# Patient Record
Sex: Male | Born: 1960 | Race: White | Hispanic: No | Marital: Married | Smoking: Former smoker
Health system: Southern US, Community
[De-identification: ages and names within clinical notes are randomized; demographics above are authoritative.]

## PROBLEM LIST (undated history)

## (undated) DIAGNOSIS — K219 Gastro-esophageal reflux disease without esophagitis: Secondary | ICD-10-CM

## (undated) DIAGNOSIS — E785 Hyperlipidemia, unspecified: Secondary | ICD-10-CM

## (undated) DIAGNOSIS — M1990 Unspecified osteoarthritis, unspecified site: Secondary | ICD-10-CM

## (undated) DIAGNOSIS — Z8614 Personal history of Methicillin resistant Staphylococcus aureus infection: Secondary | ICD-10-CM

## (undated) DIAGNOSIS — I10 Essential (primary) hypertension: Secondary | ICD-10-CM

## (undated) HISTORY — DX: Hyperlipidemia, unspecified: E78.5

## (undated) HISTORY — DX: Unspecified osteoarthritis, unspecified site: M19.90

## (undated) HISTORY — DX: Gastro-esophageal reflux disease without esophagitis: K21.9

## (undated) HISTORY — DX: Personal history of Methicillin resistant Staphylococcus aureus infection: Z86.14

---

## 2005-12-30 ENCOUNTER — Emergency Department: Payer: Self-pay

## 2006-04-23 ENCOUNTER — Ambulatory Visit: Payer: Self-pay | Admitting: Internal Medicine

## 2006-04-23 ENCOUNTER — Encounter: Payer: Self-pay | Admitting: Internal Medicine

## 2006-04-23 DIAGNOSIS — K219 Gastro-esophageal reflux disease without esophagitis: Secondary | ICD-10-CM | POA: Insufficient documentation

## 2006-04-23 DIAGNOSIS — E785 Hyperlipidemia, unspecified: Secondary | ICD-10-CM | POA: Insufficient documentation

## 2006-04-23 LAB — CONVERTED CEMR LAB
ALT: 28 units/L (ref 0–40)
AST: 25 units/L (ref 0–37)
Albumin: 4 g/dL (ref 3.5–5.2)
Alkaline Phosphatase: 62 units/L (ref 39–117)
BUN: 13 mg/dL (ref 6–23)
Basophils Absolute: 0 10*3/uL (ref 0.0–0.1)
Basophils Relative: 0.4 % (ref 0.0–1.0)
Bilirubin, Direct: 0.1 mg/dL (ref 0.0–0.3)
CO2: 31 meq/L (ref 19–32)
Calcium: 9.1 mg/dL (ref 8.4–10.5)
Chloride: 103 meq/L (ref 96–112)
Cholesterol: 281 mg/dL (ref 0–200)
Creatinine, Ser: 0.9 mg/dL (ref 0.4–1.5)
Direct LDL: 168 mg/dL
Eosinophils Absolute: 0.1 10*3/uL (ref 0.0–0.6)
Eosinophils Relative: 2.2 % (ref 0.0–5.0)
GFR calc Af Amer: 117 mL/min
GFR calc non Af Amer: 97 mL/min
Glucose, Bld: 93 mg/dL (ref 70–99)
HCT: 42.2 % (ref 39.0–52.0)
HDL: 40.4 mg/dL (ref >39.0)
Hemoglobin: 14.8 g/dL (ref 13.0–17.0)
Lymphocytes Relative: 32.7 % (ref 12.0–46.0)
MCHC: 35 g/dL (ref 30.0–36.0)
MCV: 92.8 fL (ref 78.0–100.0)
Monocytes Absolute: 0.4 10*3/uL (ref 0.2–0.7)
Monocytes Relative: 8.2 % (ref 3.0–11.0)
Neutro Abs: 3.1 10*3/uL (ref 1.4–7.7)
Neutrophils Relative %: 56.5 % (ref 43.0–77.0)
Platelets: 212 10*3/uL (ref 150–400)
Potassium: 4.4 meq/L (ref 3.5–5.1)
RBC: 4.55 M/uL (ref 4.22–5.81)
RDW: 12.1 % (ref 11.5–14.6)
Sodium: 141 meq/L (ref 135–145)
TSH: 1.69 microintl units/mL (ref 0.35–5.50)
Total Bilirubin: 0.9 mg/dL (ref 0.3–1.2)
Total CHOL/HDL Ratio: 7
Total Protein: 7.1 g/dL (ref 6.0–8.3)
Triglycerides: 316 mg/dL (ref 0–149)
VLDL: 63 mg/dL — ABNORMAL HIGH (ref 0–40)
WBC: 5.3 10*3/uL (ref 4.5–10.5)

## 2006-08-28 ENCOUNTER — Encounter (INDEPENDENT_AMBULATORY_CARE_PROVIDER_SITE_OTHER): Payer: Self-pay | Admitting: *Deleted

## 2006-08-31 ENCOUNTER — Ambulatory Visit: Payer: Self-pay | Admitting: Internal Medicine

## 2006-09-03 LAB — CONVERTED CEMR LAB
Cholesterol: 280 mg/dL (ref 0–200)
Direct LDL: 192 mg/dL
HDL: 43.3 mg/dL (ref >39.0)
Total CHOL/HDL Ratio: 6.5
Triglycerides: 167 mg/dL — ABNORMAL HIGH (ref 0–149)
VLDL: 33 mg/dL (ref 0–40)

## 2006-11-27 ENCOUNTER — Telehealth (INDEPENDENT_AMBULATORY_CARE_PROVIDER_SITE_OTHER): Payer: Self-pay | Admitting: *Deleted

## 2006-11-29 ENCOUNTER — Ambulatory Visit: Payer: Self-pay | Admitting: Internal Medicine

## 2006-11-29 DIAGNOSIS — L03119 Cellulitis of unspecified part of limb: Secondary | ICD-10-CM

## 2006-11-29 DIAGNOSIS — L02419 Cutaneous abscess of limb, unspecified: Secondary | ICD-10-CM | POA: Insufficient documentation

## 2007-03-27 ENCOUNTER — Ambulatory Visit: Payer: Self-pay | Admitting: Family Medicine

## 2007-03-27 DIAGNOSIS — G479 Sleep disorder, unspecified: Secondary | ICD-10-CM | POA: Insufficient documentation

## 2007-04-09 ENCOUNTER — Ambulatory Visit: Payer: Self-pay | Admitting: Family Medicine

## 2007-05-10 ENCOUNTER — Ambulatory Visit: Payer: Self-pay | Admitting: Family Medicine

## 2007-05-13 ENCOUNTER — Telehealth: Payer: Self-pay | Admitting: Family Medicine

## 2007-06-25 ENCOUNTER — Telehealth: Payer: Self-pay | Admitting: Family Medicine

## 2007-07-11 ENCOUNTER — Ambulatory Visit: Payer: Self-pay | Admitting: Family Medicine

## 2007-07-11 DIAGNOSIS — F341 Dysthymic disorder: Secondary | ICD-10-CM | POA: Insufficient documentation

## 2007-12-17 ENCOUNTER — Ambulatory Visit: Payer: Self-pay | Admitting: Family Medicine

## 2008-04-14 ENCOUNTER — Ambulatory Visit: Payer: Self-pay | Admitting: Family Medicine

## 2008-07-01 ENCOUNTER — Ambulatory Visit: Payer: Self-pay | Admitting: Internal Medicine

## 2008-07-02 LAB — CONVERTED CEMR LAB
Cholesterol: 309 mg/dL — ABNORMAL HIGH (ref 0–200)
Direct LDL: 179.2 mg/dL
Glucose, Bld: 95 mg/dL (ref 70–99)
HDL: 43.2 mg/dL (ref >39.00)
Total CHOL/HDL Ratio: 7
Triglycerides: 496 mg/dL — ABNORMAL HIGH (ref 0.0–149.0)
VLDL: 99.2 mg/dL — ABNORMAL HIGH (ref 0.0–40.0)

## 2008-08-25 ENCOUNTER — Telehealth: Payer: Self-pay | Admitting: Internal Medicine

## 2008-11-19 ENCOUNTER — Ambulatory Visit: Payer: Self-pay | Admitting: Internal Medicine

## 2008-11-19 DIAGNOSIS — M19049 Primary osteoarthritis, unspecified hand: Secondary | ICD-10-CM | POA: Insufficient documentation

## 2008-11-21 LAB — CONVERTED CEMR LAB: Anti Nuclear Antibody(ANA): NEGATIVE

## 2008-11-24 LAB — CONVERTED CEMR LAB
ALT: 25 units/L (ref 0–53)
AST: 21 units/L (ref 0–37)
Albumin: 4.1 g/dL (ref 3.5–5.2)
Alkaline Phosphatase: 66 units/L (ref 39–117)
BUN: 17 mg/dL (ref 6–23)
Basophils Absolute: 0.1 10*3/uL (ref 0.0–0.1)
Basophils Relative: 0.8 % (ref 0.0–3.0)
Bilirubin, Direct: 0 mg/dL (ref 0.0–0.3)
CO2: 31 meq/L (ref 19–32)
Calcium: 9.2 mg/dL (ref 8.4–10.5)
Chloride: 105 meq/L (ref 96–112)
Creatinine, Ser: 1.3 mg/dL (ref 0.4–1.5)
Eosinophils Absolute: 0.1 10*3/uL (ref 0.0–0.7)
Eosinophils Relative: 1.3 % (ref 0.0–5.0)
GFR calc non Af Amer: 62.44 mL/min (ref >60)
Glucose, Bld: 68 mg/dL — ABNORMAL LOW (ref 70–99)
HCT: 39.9 % (ref 39.0–52.0)
Hemoglobin: 13.7 g/dL (ref 13.0–17.0)
Lymphocytes Relative: 27.1 % (ref 12.0–46.0)
Lymphs Abs: 1.8 10*3/uL (ref 0.7–4.0)
MCHC: 34.4 g/dL (ref 30.0–36.0)
MCV: 96.7 fL (ref 78.0–100.0)
Monocytes Absolute: 0.6 10*3/uL (ref 0.1–1.0)
Monocytes Relative: 8.2 % (ref 3.0–12.0)
Neutro Abs: 4.2 10*3/uL (ref 1.4–7.7)
Neutrophils Relative %: 62.6 % (ref 43.0–77.0)
Phosphorus: 3.3 mg/dL (ref 2.3–4.6)
Platelets: 209 10*3/uL (ref 150.0–400.0)
Potassium: 3.9 meq/L (ref 3.5–5.1)
RBC: 4.13 M/uL — ABNORMAL LOW (ref 4.22–5.81)
RDW: 11.6 % (ref 11.5–14.6)
Rhuematoid fact SerPl-aCnc: 20 intl units/mL (ref 0.0–20.0)
Sed Rate: 17 mm/hr (ref 0–22)
Sodium: 134 meq/L — ABNORMAL LOW (ref 135–145)
TSH: 1.78 microintl units/mL (ref 0.35–5.50)
Total Bilirubin: 1 mg/dL (ref 0.3–1.2)
Total Protein: 7.2 g/dL (ref 6.0–8.3)
WBC: 6.8 10*3/uL (ref 4.5–10.5)

## 2008-12-04 ENCOUNTER — Ambulatory Visit: Payer: Self-pay | Admitting: Internal Medicine

## 2008-12-10 ENCOUNTER — Encounter: Payer: Self-pay | Admitting: Internal Medicine

## 2008-12-16 ENCOUNTER — Ambulatory Visit: Payer: Self-pay | Admitting: Family Medicine

## 2008-12-16 DIAGNOSIS — J019 Acute sinusitis, unspecified: Secondary | ICD-10-CM | POA: Insufficient documentation

## 2008-12-18 ENCOUNTER — Ambulatory Visit: Payer: Self-pay | Admitting: Rheumatology

## 2008-12-23 ENCOUNTER — Encounter: Payer: Self-pay | Admitting: Internal Medicine

## 2009-02-09 ENCOUNTER — Telehealth: Payer: Self-pay | Admitting: Internal Medicine

## 2009-07-07 ENCOUNTER — Ambulatory Visit: Payer: Self-pay | Admitting: Internal Medicine

## 2009-07-07 DIAGNOSIS — R079 Chest pain, unspecified: Secondary | ICD-10-CM | POA: Insufficient documentation

## 2009-07-22 LAB — CONVERTED CEMR LAB
ALT: 26 units/L (ref 0–53)
AST: 24 units/L (ref 0–37)
Albumin: 4.1 g/dL (ref 3.5–5.2)
Alkaline Phosphatase: 67 units/L (ref 39–117)
BUN: 15 mg/dL (ref 6–23)
Basophils Absolute: 0 10*3/uL (ref 0.0–0.1)
Basophils Relative: 0.7 % (ref 0.0–3.0)
Bilirubin, Direct: 0.1 mg/dL (ref 0.0–0.3)
CO2: 28 meq/L (ref 19–32)
Calcium: 9.7 mg/dL (ref 8.4–10.5)
Chloride: 105 meq/L (ref 96–112)
Cholesterol: 277 mg/dL — ABNORMAL HIGH (ref 0–200)
Creatinine, Ser: 1.3 mg/dL (ref 0.4–1.5)
Direct LDL: 157.9 mg/dL
Eosinophils Absolute: 0.2 10*3/uL (ref 0.0–0.7)
Eosinophils Relative: 3.2 % (ref 0.0–5.0)
GFR calc non Af Amer: 62.84 mL/min (ref >60)
Glucose, Bld: 106 mg/dL — ABNORMAL HIGH (ref 70–99)
HCT: 40.6 % (ref 39.0–52.0)
HDL: 44.7 mg/dL (ref >39.00)
Hemoglobin: 14.4 g/dL (ref 13.0–17.0)
Lymphocytes Relative: 30.5 % (ref 12.0–46.0)
Lymphs Abs: 1.7 10*3/uL (ref 0.7–4.0)
MCHC: 35.3 g/dL (ref 30.0–36.0)
MCV: 96.1 fL (ref 78.0–100.0)
Monocytes Absolute: 0.4 10*3/uL (ref 0.1–1.0)
Monocytes Relative: 7.4 % (ref 3.0–12.0)
Neutro Abs: 3.2 10*3/uL (ref 1.4–7.7)
Neutrophils Relative %: 58.2 % (ref 43.0–77.0)
Phosphorus: 3.3 mg/dL (ref 2.3–4.6)
Platelets: 213 10*3/uL (ref 150.0–400.0)
Potassium: 5.3 meq/L — ABNORMAL HIGH (ref 3.5–5.1)
RBC: 4.23 M/uL (ref 4.22–5.81)
RDW: 13.1 % (ref 11.5–14.6)
Sodium: 141 meq/L (ref 135–145)
TSH: 2.73 microintl units/mL (ref 0.35–5.50)
Total Bilirubin: 0.5 mg/dL (ref 0.3–1.2)
Total CHOL/HDL Ratio: 6
Total Protein: 7 g/dL (ref 6.0–8.3)
Triglycerides: 342 mg/dL — ABNORMAL HIGH (ref 0.0–149.0)
VLDL: 68.4 mg/dL — ABNORMAL HIGH (ref 0.0–40.0)
WBC: 5.6 10*3/uL (ref 4.5–10.5)

## 2009-11-06 ENCOUNTER — Emergency Department: Payer: Self-pay | Admitting: Emergency Medicine

## 2009-12-03 ENCOUNTER — Ambulatory Visit: Payer: Self-pay | Admitting: Internal Medicine

## 2009-12-03 DIAGNOSIS — L049 Acute lymphadenitis, unspecified: Secondary | ICD-10-CM | POA: Insufficient documentation

## 2009-12-30 ENCOUNTER — Encounter: Payer: Self-pay | Admitting: Internal Medicine

## 2010-02-23 ENCOUNTER — Encounter: Payer: Self-pay | Admitting: Internal Medicine

## 2010-03-15 NOTE — Letter (Signed)
Summary: Hand pain/[facility]  Hand pain/[facility]   Imported By: Sherian Rein 12/21/2008 07:47:18  _____________________________________________________________________  External Attachment:    Type:   Image     Comment:   External Document  Appended Document: Hand pain/[facility] work up for synovitis started checking MRI and may inject

## 2010-03-15 NOTE — Letter (Signed)
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

## 2010-03-15 NOTE — Assessment & Plan Note (Signed)
Summary: CPX   Vital Signs:  Patient Profile:   50 Years Old Male Height:     69.75 inches Weight:      207.13 pounds Temp:     99.1 degrees F oral Pulse rate:   68 / minute BP sitting:   96 / 58  (right arm)  Vitals Entered By: Wandra Mannan (August 31, 2006 9:20 AM)               Chief Complaint:  cpx  fasting.  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      has noticed close up vision changes  ENT      Denies decreased hearing and ringing in ears.      Teeth okay--sees dentist regularly  CV      Denies difficulty breathing at night, fainting, palpitations, and shortness of breath with exertion.   Resp      Denies cough and shortness of breath.  GI      Denies abdominal pain, bloody stools, change in bowel habits, constipation, nausea, and vomiting.      heartburn controlled by nexium  GU      Denies decreased libido, erectile dysfunction, incontinence, and urinary hesitancy.      occ nocturia  MS      some knee arthritis---cold weather exacerbates with stiffness---no meds  Derm      Denies lesion(s) and rash.      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      R knee has numb feeling--occ kneels and gets numb down lateral upper calf  Psych      Denies anxiety and depression.  Heme      Denies abnormal bruising and enlarge lymph nodes.  Allergy      Denies seasonal allergies.   Physical  Exam  General:     alert and normal appearance.   Eyes:     pupils equal, pupils round, pupils reactive to light, and no optic disk abnormalities.   Ears:     R ear normal and L ear normal.   Mouth:     no lesions.   Neck:     supple, no masses, no thyromegaly, no carotid bruits, and no cervical lymphadenopathy.   Lungs:     normal respiratory effort and normal breath sounds.   Heart:     normal rate, regular rhythm, no murmur, and no gallop.   Abdomen:     soft, non-tender, no masses, no inguinal hernia, no hepatomegaly, and no splenomegaly.   Genitalia:     circumcised, no scrotal masses, and no testicular masses or atrophy.   Msk:     no joint tenderness and no joint swelling.   Pulses:     1= in feet Extremities:     no edema Skin:     no rashes and no suspicious lesions.   Axillary Nodes:     No palpable lymphadenopathy Psych:     normally interactive, good eye contact, not anxious appearing, and not depressed appearing.      Impression & Recommendations:  Problem # 1:  HEALTH MAINTENANCE EXAM (ICD-V70.0) Assessment: Comment Only healthy counselled  Problem # 2:  HYPERLIPIDEMIA (ICD-272.4) Assessment: Unchanged  Discussed fish oil would avoid other meds at least now--may want to consider as his risk goes up Orders: Venipuncture (16109) TLB-Lipid Panel (80061-LIPID)   Medications Added to Medication List This Visit: 1)  Nexium 40 Mg Pack (Esomeprazole magnesium) .Marland Kitchen.. 1 daily   Patient Instructions: 1)  physical in 1-2 years       Prior Medications (reviewed today): Current Allergies (reviewed today): CODEINE SULFATE (CODEINE SULFATE)  Appended Document: CPX discussed trying famotidine or omeprazole OTC for GER

## 2010-03-15 NOTE — Miscellaneous (Signed)
  Clinical Lists Changes  Allergies: Added new allergy or adverse reaction of CODEINE SULFATE (CODEINE SULFATE) Observations: Added new observation of NKA: F (08/28/2006 16:38)

## 2010-03-15 NOTE — Assessment & Plan Note (Signed)
Summary: 1:45  L HAND/CLE   Vital Signs:  Patient profile:   50 year old male Weight:      212 pounds Temp:     98.2 degrees F oral Pulse rate:   80 / minute Pulse rhythm:   regular BP sitting:   120 / 70  (left arm) Cuff size:   regular  Vitals Entered By: Mervin Hack CMA (AAMA) (November 19, 2008 1:50 PM)  History of Present Illness: Chief Complaint: left hand pain  Having swelling and inflammation in left hand for about 2 weeks Lots of pain  No known injury works with hands all the time but nothing new Taking advil-- 400 twice today and then took an aleve does relieve some of the aching  Worsens as the day goes on still present in AM but not as bad  No redness No fever  Some tiredness and fatigue  Allergies: 1)  Codeine Sulfate (Codeine Sulfate)  Past History:  Past medical, surgical, family and social histories (including risk factors) reviewed for relevance to current acute and chronic problems.  Past Medical History: Reviewed history from 04/23/2006 and no changes required. GERD Hyperlipidemia MRSA after chain saw accident  Family History: Dad died brain tumor 07-05-44) Mom--HTN 1 brother  No prostate or colon cancer No CAD Both GM and pat GF with rheumatoid arthritis  Social History: Reviewed history from 07/01/2008 and no changes required. 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   Neck:  supple, no masses, no thyromegaly, no carotid bruits, and no cervical lymphadenopathy.   Lungs:  normal respiratory effort and normal breath sounds.   Heart:  normal rate, regular rhythm, no murmur,  and no gallop.   Abdomen:  soft and non-tender.   Genitalia:  no scrotal masses, no testicular masses or atrophy, and no cutaneous lesions.   Msk:  swelling and synovitis of left 3rd MCP all other joints quiet No redness   mild tenderness in just that joint Extremities:  no edema Neurologic:  alert & oriented X3, strength normal in all extremities, and gait normal.   Skin:  no rashes and no suspicious lesions.   Axillary Nodes:  No palpable lymphadenopathy Inguinal Nodes:  No significant adenopathy Psych:  normally interactive, good eye contact, not anxious appearing, and not depressed appearing.   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) Assessment New  strong FH of rhematic diseases Some fatigue, otherwise no systemic symptoms only 1 joint---not suggestive of RA  P: check blood work      ibuprofen     consider rheum eval  Orders: Specimen Handling (16109) T-Antinuclear Antib (ANA) (60454-09811) TLB-Sedimentation Rate (ESR) (85652-ESR) TLB-Rheumatoid Factor (RA) (91478-GN) Radiology other (Radiology Other)  Problem # 2:  FATIGUE (ICD-780.79) Assessment: New  non specific will check labs ?related to arthritis  Orders: TLB-Renal Function Panel (80069-RENAL) TLB-CBC Platelet - w/Differential (85025-CBCD) TLB-Hepatic/Liver Function Pnl (80076-HEPATIC) TLB-TSH (Thyroid Stimulating Hormone) (84443-TSH) Venipuncture (56213)  Complete Medication List: 1)  Nexium 40 Mg Pack (Esomeprazole magnesium) .Marland Kitchen.. 1 daily 2)  Omega-3-6-9 Caps (Omega  3-6-9 fatty acids) .... Take 1 capsule by mouth once a day 3)  B-12 100 Mcg Tabs (Cyanocobalamin) .... Take one by mouth daily 4)  Alprazolam 0.5 Mg Tabs (Alprazolam) .... Take 1 by mouth once daily as needed  Patient Instructions: 1)  Please take ibuprofen 200mg  -- 4 tabs three times a day with food 2)  Please schedule a follow-up  appointment in 2 weeks.   Current Allergies (reviewed today): CODEINE SULFATE (CODEINE SULFATE)

## 2010-03-15 NOTE — Assessment & Plan Note (Signed)
Summary: 2 wk f/u dlo   Vital Signs:  Patient profile:   50 year old male Weight:      211 pounds Temp:     98.3 degrees F oral Pulse rate:   80 / minute Pulse rhythm:   regular BP sitting:   110 / 78  (left arm) Cuff size:   regular  Vitals Entered By: Mervin Hack CMA Duncan Dull) (December 04, 2008 3:04 PM)  History of Present Illness: Chief Complaint: 2 week follow-up  Really not any better Still has considerable swelling in left hand notable swelling still of knuckles  esp gets bad after working ibuprofen not really helping  heat seems to help some--but stops when heat taken away Ice not really helpful  Allergies: 1)  Codeine Sulfate (Codeine Sulfate)  Past History:  Past medical, surgical, family and social histories (including risk factors) reviewed for relevance to current acute and chronic problems.  Past Medical History: Reviewed history from 04/23/2006 and no changes required. 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 Dad died brain tumor 24-Jul-2044) Mom--HTN 1 brother  No prostate or colon cancer No CAD Both GM and pat GF with rheumatoid arthritis  Social History: Reviewed history from 07/01/2008 and no changes required. 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   Msk:  still with synovitis in left 3rd MCP and ?2nd and 3rd PIPs   Impression & Recommendations:  Problem # 1:  UNSPECIFIED ARTHOPATHY, HAND (ZOX-096.04) Assessment Unchanged  will make referral to rheumatology  Orders: Rheumatology Referral (Rheumatology)  Complete Medication List: 1)  Nexium 40 Mg Pack (Esomeprazole magnesium) .Marland Kitchen.. 1 daily 2)  Omega-3-6-9 Caps (Omega 3-6-9  fatty acids) .... Take 1 capsule by mouth once a day 3)  B-12 100 Mcg Tabs (Cyanocobalamin) .... Take one by mouth daily 4)  Alprazolam 0.5 Mg Tabs (Alprazolam) .... Take 1 by mouth once daily as needed  Patient Instructions: 1)  Please schedule yearly physical for sometime after next May 2)  Referral Appointment Information 3)  Day/Date: 4)  Time: 5)  Place/MD: 6)  Address: 7)  Phone/Fax: 8)  Patient given appointment information. Information/Orders faxed/mailed.  Current Allergies (reviewed today): CODEINE SULFATE (CODEINE SULFATE)

## 2010-03-15 NOTE — Assessment & Plan Note (Signed)
Summary: FOLLOW UP/[facility]   Vital Signs:  Patient Profile:   50 Years Old Male Height:     69.75 inches Weight:      192.25 pounds Temp:     98.4 degrees F oral Pulse rate:   76 / minute Pulse rhythm:   regular BP sitting:   112 / 80  (left arm) Cuff size:   regular  Vitals Entered By: Delilah Shan (May 10, 2007 9:54 AM)                 Chief Complaint:  Follow up.  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 Alert and cooperative with normal attention span and concentration. 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 Discussed SE and benifits. Don't stop suddenly.  Use xanax in limited fashion. Follow up in 1 month.  15 minutes spent counc=sling.  Complete Medication List: 1)  Nexium 40 Mg Pack (Esomeprazole magnesium) .Marland Kitchen.. 1 daily 2)  Alprazolam Xr 0.5 Mg Tb24 (Alprazolam) .Marland Kitchen.. 1 tab by mouth two times a day as needed panic attacks and anxiety 3)  Trazodone Hcl 50 Mg Tabs (Trazodone hcl) .Marland Kitchen.. 1 tab by mouth qhs as needed insomnia 4)  Sertraline Hcl 25 Mg Tabs (Sertraline hcl) .... Take 1 tablet by mouth once a day   Patient Instructions: 1)  Please schedule a follow-up appointment in 1 month anxiety.     Prescriptions: ALPRAZOLAM XR 0.5 MG  TB24 (ALPRAZOLAM) 1 tab by mouth two times a day as needed panic attacks and anxiety  #60 x 0   Entered and Authorized by:   [name] MD   Signed by:   [name] MD on 05/10/2007   Method used:   Print then Give to Patient   RxID:   [id number] SERTRALINE HCL 25 MG  TABS (SERTRALINE HCL) Take 1 tablet by mouth once a day  #30 x 3   Entered and Authorized by:   [name] MD   Signed by:   [name] MD on 05/10/2007   Method used:   Print then Give to Patient   RxID:   [id number]  ] Current Allergies (reviewed today): CODEINE SULFATE (CODEINE SULFATE) Current Medications (including changes made in today's visit):  NEXIUM 40 MG  PACK (ESOMEPRAZOLE MAGNESIUM) 1 daily ALPRAZOLAM XR 0.5 MG  TB24 (ALPRAZOLAM) 1 tab by mouth two times a day as needed panic attacks and anxiety TRAZODONE HCL 50 MG  TABS (TRAZODONE HCL) 1 tab by mouth qHS as needed insomnia SERTRALINE HCL 25 MG  TABS (SERTRALINE HCL) Take 1 tablet by mouth once a day

## 2010-03-15 NOTE — Assessment & Plan Note (Signed)
Summary: BACK & LEFT LEG PAIN / LFW   Vital Signs:  Patient Profile:   50 Years Old Male Height:     69.75 inches Weight:      219 pounds BMI:     31.76 Temp:     97.4 degrees F oral Pulse rate:   60 / minute Pulse rhythm:   regular BP sitting:   120 / 84  (left arm) Cuff size:   large  Vitals Entered By: Liane Comber (April 14, 2008 2:54 PM)                 Chief Complaint:  back leg pain.  History of Present Illness: yesterday was moving furniture - raising side of heavy object pain in L low back- caught him sharply-- now rad to leg and buttocks  took some aleve and used heating pad yesterday with some help  has pulled muscles in back in the past  no disc injuries in past   no numbness or weakness in his leg - just pain  can walk with discomfort - hurts to straighten up   best to lie down on R side with knees flexed  worse to lie on stomach      Current Allergies (reviewed today): CODEINE SULFATE (CODEINE SULFATE)  Past Medical History:    Reviewed history from 04/23/2006 and no changes required:       GERD       Hyperlipidemia       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  ENT      no sneezing   CV      Denies chest pain or discomfort.  Resp      Denies cough and shortness of breath.  GI      Denies abdominal pain, nausea, and vomiting.  GU      Denies dysuria and  urinary frequency.  MS      Complains of stiffness.      Denies cramps and muscle weakness.  Derm      Denies itching and rash.  Neuro      Denies numbness, tremors, and weakness.   Physical Exam  General:     well appearing but in some discomfort from back Mouth:     pharynx pink and moist.   Neck:     nl rom of neck Lungs:     Normal respiratory effort, chest expands symmetrically. Lungs are clear to auscultation, no crackles or wheezes. Heart:     RRR without M Msk:     mild tenderness of bony lower LS  tender/ tightness of L lower paraspinal muscles/ also piriformis area  flex 20 deg- limited by pain, ext full with some discomfort, L flex 10-20 deg, R flex full able to twist  SLR with low back pain but not leg or foot   Extremities:     No clubbing, cyanosis, edema, or deformity noted with normal full range of motion of all joints.   Neurologic:     - gait labored due to pain- no foot drop nl strength in bilat LEs sensation intact to light touch and DTRs symmetrical and normal.   Skin:     Intact without suspicious lesions or rashes Psych:     normal affect, talkative and pleasant     Impression & Recommendations:  Problem # 1:  BACK PAIN (ICD-724.5) Assessment: New suspet lumbar strain/ spasm with radiation to leg but no other neurologic s/s adv gentle heat and activity/ slow walking without lifting  naproxen 500 two times a day with food , and flexeril as needed (warned of sedation) adv to update if no imp in 2-3 days - would consider PT eval, and consider films if inc leg pain or any neurol symptoms  His updated medication list for this problem includes:    Ec-naprosyn 500 Mg Tbec (Naproxen) .Marland Kitchen... 1 by mouth two times a day with food as needed back pain    Flexeril 10 Mg Tabs (Cyclobenzaprine hcl) .Marland Kitchen... 1 by mouth up to three times a day as needed back pain   Complete Medication List: 1)  Nexium 40 Mg Pack (Esomeprazole magnesium) .Marland Kitchen.. 1 daily 2)   Omega-3-6-9 Caps (Omega 3-6-9 fatty acids) .... Take 1 capsule by mouth once a day 3)  Ec-naprosyn 500 Mg Tbec (Naproxen) .Marland Kitchen.. 1 by mouth two times a day with food as needed back pain 4)  Flexeril 10 Mg Tabs (Cyclobenzaprine hcl) .Marland Kitchen.. 1 by mouth up to three times a day as needed back pain   Patient Instructions: 1)  keep using gentle heat on low back 2)  take the naproxen twice daily with food for 1 week (stop it if it bothers your stomach) 3)  take flexeril (muscle relaxer) with caution because it may sedate 4)  update me if any numbness or weakness in leg or foot 5)  update me if not improved in 2-3 days    Prescriptions: FLEXERIL 10 MG TABS (CYCLOBENZAPRINE HCL) 1 by mouth up to three times a day as needed back pain  #30 x 0   Entered by:   Judith Part MD   Authorized by:   [name] MD   Signed by:   Judith Part MD on 04/14/2008   Method used:   Print then Give to Patient   RxID:   [id number] EC-NAPROSYN 500 MG TBEC (NAPROXEN) 1 by mouth two times a day with food as needed back pain  #30 x 0   Entered by:   Judith Part MD   Authorized by:   [name] MD   Signed by:   Judith Part MD on 04/14/2008   Method used:   Print then Give to Patient   RxID:   [id number]

## 2010-03-15 NOTE — Assessment & Plan Note (Signed)
Summary: ? MRSA   Vital Signs:  Patient Profile:   50 Years Old Male Height:     69.75 inches Weight:      208 pounds Temp:     99.2 degrees F oral Pulse rate:   68 / minute BP sitting:   122 / 78  (right arm)  Vitals Entered By: Wandra Mannan (November 29, 2006 10:16 AM)                 Chief Complaint:  ? MRSA.  History of Present Illness: having 3rd round with apparent MRSA Running in family--wife, son, daughter and grandson all have had MRSA He and grandson have had cultures  Infection has been around left knee Got MRSA when sewed up in ER Has had recurrences there 3 times did try bactroban to nares with first episode  wonders about how to prevent it and finally get it out of the family Noone has had problems for the last 2 months  Current Allergies: CODEINE SULFATE (CODEINE SULFATE)  Past Medical History:    Reviewed history from 04/23/2006 and no changes required:       GERD       Hyperlipidemia       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  NAD Skin:     single inflammed papule with central crusting on inner left thigh about 2/3rds down to knee Warm and sig tenderness    Impression & Recommendations:  Problem # 1:  CELLULITIS, LEFT LEG (ICD-682.6) Assessment: New Recurrent Probably MRSA will Rx with clinda Warm compresses for drainage if possible Consider ID consutl--he wants to wait and consider   His updated medication list for this problem includes:    Cleocin 300 Mg Caps (Clindamycin hcl) .Marland Kitchen... 1 three times a day   Complete Medication List:  1)  Nexium 40 Mg Pack (Esomeprazole magnesium) .Marland Kitchen.. 1 daily 2)  Cleocin 300 Mg Caps (Clindamycin hcl) .Marland Kitchen.. 1 three times a day   Patient Instructions: 1)  Please schedule a follow-up appointment as needed. 2)  Dr Blocker in [place] is an Infectious Disease specialist you can go to for consultation if desired    Prescriptions: CLEOCIN 300 MG  CAPS (CLINDAMYCIN HCL) 1 three times a day  #30 x 1   Entered and Authorized by:   Cindee Salt MD   Signed by:   Cindee Salt MD on 11/29/2006   Method used:   Electronically sent to ...       [facility]*       [address]       [place], [place]  [address]       Ph: [phone] or [phone]       Fax: [phone]   RxID:   [id number]  ]

## 2010-03-15 NOTE — Progress Notes (Signed)
Summary: ALPRAZOLAM   Phone Note Refill Request Message from:  [facility] on February 09, 2009 10:07 AM  Refills Requested: Medication #1:  ALPRAZOLAM 0.5 MG TABS take 1 by mouth once daily as needed.   Last Refilled: 01/29/2009 Form on your desk    Method Requested: Fax to Local Pharmacy Initial call taken by: DeShannon Smith CMA Duncan Dull),  February 09, 2009 10:07 AM  Follow-up for Phone Call        okay #30 x 1 Follow-up by: Cindee Salt MD,  February 09, 2009 1:57 PM  Additional Follow-up for Phone Call Additional follow up Details #1::        Rx faxed to pharmacy Additional Follow-up by: DeShannon Smith CMA Duncan Dull),  February 09, 2009 2:35 PM    Prescriptions: ALPRAZOLAM 0.5 MG TABS (ALPRAZOLAM) take 1 by mouth once daily as needed  #30 x 1   Entered by:   Mervin Hack CMA (AAMA)   Authorized by:   Cindee Salt MD   Signed by:   Mervin Hack CMA (AAMA) on 02/09/2009   Method used:   Handwritten   RxID:   [id number]

## 2010-03-15 NOTE — Assessment & Plan Note (Signed)
Summary: HAVING ISSUES AT HOME/DLO   Vital Signs:  Patient Profile:   50 Years Old Male Height:     69.75 inches Weight:      201.38 pounds Temp:     97.9 degrees F oral Pulse rate:   76 / minute Pulse rhythm:   regular Resp:     16 per minute BP sitting:   130 / 88  (left arm) Cuff size:   regular  Vitals Entered By: Delilah Shan (March 27, 2007 9:48 AM)                 Chief Complaint:  Issues at home.  History of Present Illness: Wife left him 1/27, ? 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 Lungs are clear to auscultation, no crackles or wheezes. Heart:     Normal rate and regular rhythm. S1 and S2 normal without gallop, murmur, click, rub or other extra sounds. Psych:     Cognition and judgment appear intact. Alert and cooperative with normal attention span and concentration. 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 Given info on helpline. Treat with anxiolytic for short term prn. MAy need atidepressant in future in symptoms not improving.   Problem # 2:  INSOMNIA (ICD-780.52) Treat with trazodone.  Complete Medication List: 1)  Nexium 40 Mg Pack (Esomeprazole magnesium) .Marland Kitchen.. 1 daily 2)  Alprazolam Xr 0.5 Mg Tb24 (Alprazolam) .Marland Kitchen.. 1 tab by mouth two  times a day as needed panic attacks and anxiety 3)  Trazodone Hcl 50 Mg Tabs (Trazodone hcl) .Marland Kitchen.. 1 tab by mouth qhs as needed insomnia   Patient Instructions: 1)  Please schedule a follow-up appointment in 2 weeks Mood. 2)  Keep appt with counsolor.    Prescriptions: TRAZODONE HCL 50 MG  TABS (TRAZODONE HCL) 1 tab by mouth qHS as needed insomnia  #30 x 0   Entered and Authorized by:   [name] MD   Signed by:   [name] MD on 03/27/2007   Method used:   Print then Give to Patient   RxID:   [id number] ALPRAZOLAM XR 0.5 MG  TB24 (ALPRAZOLAM) 1 tab by mouth two times a day as needed panic attacks and anxiety  #30 x 0   Entered and Authorized by:   [name] MD   Signed by:   [name] MD on 03/27/2007   Method used:   Print then Give to Patient   RxID:   [id number]  ] Current Allergies (reviewed today): CODEINE SULFATE (CODEINE SULFATE) Current Medications (including changes made in today's visit):  NEXIUM 40 MG  PACK (ESOMEPRAZOLE MAGNESIUM) 1 daily ALPRAZOLAM XR 0.5 MG  TB24 (ALPRAZOLAM) 1 tab by mouth two times a day as needed panic attacks and anxiety TRAZODONE HCL 50 MG  TABS (TRAZODONE HCL) 1 tab by mouth qHS as needed insomnia

## 2010-03-15 NOTE — Progress Notes (Signed)
Summary: ? about Zoloft  Phone Note Call from Patient Call back at [phone] or [phone]   Caller: Patient Call For: DErmalene Searing Summary of Call: Pt was put on Zoloft recently and he feels that he is getting worse, is this normal after starting the medication?.  Pt is real nervous and having cold sweats that come and go and is real weak.   Should he give the medication more time to work or what should he do? [facility] - [facility] Initial call taken by: Sydell Axon,  May 13, 2007 9:31 AM  Follow-up for Phone Call        MAy be Se to med.  Try taking half tablet daily, if not tolerating in 1week let me know and we can try a different medication.   Follow-up by: [name] MD,  May 13, 2007 9:38 AM  Additional Follow-up for Phone Call Additional follow up Details #1::        Patient Advised.  Additional Follow-up by: Delilah Shan,  May 13, 2007 10:39 AM

## 2010-03-15 NOTE — Progress Notes (Signed)
Summary: refill request for xanax  Phone Note Refill Request Call back at [phone] Message from:  Patient  Refills Requested: Medication #1:  alprazolam Phoned request from pt, he requests refill.  This helps him to rest better. This is no longer on med list.  [facility].  Initial call taken by: Lowella Petties CMA,  August 25, 2008 8:54 AM  Follow-up for Phone Call        okay to refill  0.5mg  1 at bedtime as needed  #30 x 1 Follow-up by: Cindee Salt MD,  August 25, 2008 2:08 PM  Additional Follow-up for Phone Call Additional follow up Details #1::        Rx called to pharmacy and patient advised Additional Follow-up by: Mervin Hack CMA,  August 25, 2008 2:54 PM    New/Updated Medications: ALPRAZOLAM 0.5 MG TABS (ALPRAZOLAM) take 1 by mouth by mouth once daily as needed ALPRAZOLAM 0.5 MG TABS (ALPRAZOLAM) take 1 by mouth once daily as needed Prescriptions: ALPRAZOLAM 0.5 MG TABS (ALPRAZOLAM) take 1 by mouth by mouth once daily as needed  #30 x 1   Entered by:   Mervin Hack CMA   Authorized by:   Cindee Salt MD   Signed by:   Mervin Hack CMA on 08/25/2008   Method used:   Telephoned to ...       [facility]* (retail)       [address]       [place], [place]  [address]       Ph: [phone]       Fax: [phone]   RxID:   [id number]

## 2010-03-15 NOTE — Assessment & Plan Note (Signed)
Summary: SWOLLEN GLANDS, SORE THROAT   Vital Signs:  Patient profile:   50 year old male Height:      69.5 inches Weight:      210.13 pounds BMI:     30.70 Temp:     98.3 degrees F oral Pulse rate:   84 / minute Pulse rhythm:   regular BP sitting:   114 / 80  (left arm) Cuff size:   regular  Vitals Entered By: Delilah Shan CMA Duncan Dull) (December 16, 2008 2:59 PM) CC: Swollen glands   History of Present Illness: 10 day h/o facial pressure, congestion, productive cough. Felt feverish, but afebrile. No wheezing, no shortness of breath.   Ear pressure, scratchy throat. Has been taking Mucinex OTC with minimal relief of syptoms.  Current Medications (verified): 1)  Nexium 40 Mg  Pack (Esomeprazole Magnesium) .Marland Kitchen.. 1 Daily 2)  Omega-3-6-9  Caps (Omega 3-6-9 Fatty Acids) .... Take 1 Capsule By Mouth Once A Day 3)  B-12 100 Mcg Tabs (Cyanocobalamin) .... Take One By Mouth Daily 4)  Alprazolam 0.5 Mg Tabs (Alprazolam) .... Take 1 By Mouth Once Daily As Needed 5)  Augmentin 500-125 Mg Tabs (Amoxicillin-Pot Clavulanate) .Marland Kitchen.. 1 By Mouth 3 Times Daily X 10 Days  Allergies: 1)  Codeine Sulfate (Codeine Sulfate)  Review of Systems      See HPI General:  Complains of chills; denies fever. CV:  Denies chest pain or discomfort. Resp:  Complains of cough, pleuritic, and sputum productive; denies shortness of breath and wheezing.  Physical Exam  General:  alert and normal appearance.   Head:  TTP over frontal sinuses, right>left Ears:  R ear normal and L ear normal.   Nose:  mucosal erythema.   Mouth:   erythema,  no lesions.   Lungs:  normal respiratory effort  Occassional exp wheezes.   Heart:  normal rate, regular rhythm, no murmur, and no gallop.   Abdomen:  soft and non-tender.     Impression & Recommendations:  Problem # 1:  ACUTE SINUSITIS, UNSPECIFIED (ICD-461.9) Assessment New with probable bronchitis.  Will treat with Augmentin.  Continue supportive care.  See patient  instrucitons for details. His updated medication list for this problem includes:    Augmentin 500-125 Mg Tabs (Amoxicillin-pot clavulanate) .Marland Kitchen... 1 by mouth 3 times daily x 10 days  Complete Medication List: 1)  Nexium 40 Mg Pack (Esomeprazole magnesium) .Marland Kitchen.. 1 daily 2)  Omega-3-6-9 Caps (Omega 3-6-9 fatty acids) .... Take 1 capsule by mouth once a day 3)  B-12 100 Mcg Tabs (Cyanocobalamin) .... Take one by mouth daily 4)  Alprazolam 0.5 Mg Tabs (Alprazolam) .... Take 1 by mouth once daily as needed 5)  Augmentin 500-125 Mg Tabs (Amoxicillin-pot clavulanate) .Marland Kitchen.. 1 by mouth 3 times daily x 10 days  Patient Instructions: 1)  Recommended voice rest for laryngitis, Warm honey tea.  Treat sympotmatically  with guafenesin, nasal saline irrigation.  Cough suppressant at night.  2)  Finish augmentin but please call if symptoms not improving in 5 days. Prescriptions: AUGMENTIN 500-125 MG TABS (AMOXICILLIN-POT CLAVULANATE) 1 by mouth 3 times daily x 10 days  #30 x 0   Entered and Authorized by:   Ruthe Mannan MD   Signed by:   Ruthe Mannan MD on 12/16/2008   Method used:   Electronically to        [facility]* (retail)       [address]       [place], [place]  [address]  Ph: [phone]       Fax: [phone]   RxID:   [id number]   Current Allergies (reviewed today): CODEINE SULFATE (CODEINE SULFATE)

## 2010-03-15 NOTE — Assessment & Plan Note (Signed)
Summary: SWOLLEN GLANDS/CLE   Vital Signs:  Patient profile:   50 year old male Height:      69.5 inches Weight:      222.75 pounds BMI:     32.54 Temp:     98.0 degrees F oral Pulse rate:   76 / minute Pulse rhythm:   regular BP sitting:   128 / 84  (left arm) Cuff size:   large  Vitals Entered By: Selena Batten Dance CMA Duncan Dull) (December 03, 2009 11:28 AM) CC: Right ear pain   History of Present Illness: CC: R ear pain  battles with R ear infections since childhood.  Now feeling 1wk h/o pain in R ear (mainly R neck inferior to ear) as well as swollen tender glands under ear R side of neck.  No drainage from ear, no hearing change, no ringing in ears, no dizziness.  Tried advil which helps.  Going on camping trip this weekend, would like to feel better for this.    No fevers/chills, congestion, RN, cough.  No HA, sinus pain or pressure.  No more mucous than normal.  No sick contacts at home.  h/o smoking, not currently.  + smokers at home, outside (wife, son).    reviewing EMR, has been treated for sinusitis in past.  Current Medications (verified): 1)  Nexium 40 Mg  Pack (Esomeprazole Magnesium) .Marland Kitchen.. 1 Daily 2)  Omega-3-6-9  Caps (Omega 3-6-9 Fatty Acids) .... Take 1 Capsule By Mouth Once A Day 3)  Alprazolam 0.5 Mg Tabs (Alprazolam) .... 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   Head:  no sinus tenderness Eyes:  pupils equal, pupils round, pupils reactive to light Ears:  R ear normal and L ear normal.     Nose:  mucosal erythema.   Mouth:  no erythema, no exudates, and no lesions.   Neck:  R AC LAD Lungs:  normal respiratory effort and normal breath sounds.   Heart:  normal rate, regular rhythm, no murmur, and no gallop.   Pulses:  2+ rad pulses Extremities:  no edema Skin:  no rashes and no suspicious lesions.     Impression & Recommendations:  Problem # 1:  ACUTE LYMPHADENITIS (ICD-683) likely just viral/inflammatory process, however given duration 1wk+, provided with WASP zpack.  ears clear today.  red flags to return discussed.  continue NSAID.  His updated medication list for this problem includes:    Zithromax Z-pak 250 Mg Tabs (Azithromycin) .Marland Kitchen... Take as directed  Complete Medication List: 1)  Nexium 40 Mg Pack (Esomeprazole magnesium) .Marland Kitchen.. 1 daily 2)  Omega-3-6-9 Caps (Omega 3-6-9 fatty acids) .... Take 1 capsule by mouth once a day 3)  Alprazolam 0.5 Mg Tabs (Alprazolam) .... Take 1 by mouth two times a day as needed for nerves 4)  Zithromax Z-pak 250 Mg Tabs (Azithromycin) .... Take as directed  Patient Instructions: 1)  This sounds more viral. 2)  Plenty of fluid and rest over weekend, continue advil for inflammation. 3)  Zpack to hold on  to in case not improving as expected. 4)  If you start having fever >101.5, worsening swelling or trouble swallowing or breathing or opening mouth, you will need to be seen again. 5)  Call clinic with questions, good to meet you today Prescriptions: ZITHROMAX Z-PAK 250 MG TABS (AZITHROMYCIN) take as directed  #1 x 0   Entered and Authorized by:   Eustaquio Boyden  MD   Signed by:   Eustaquio Boyden  MD on 12/03/2009   Method used:   Electronically to        [facility]* (retail)       [address]       [place], [place]  [address]       Ph: [phone]       Fax: [phone]   RxID:   [id number]    Orders Added: 1)  Est. Patient Level III [84132]    Current Allergies (reviewed today): CODEINE SULFATE (CODEINE  SULFATE)  Appended Document: SWOLLEN GLANDS/CLE ear exam:  bilateral TMs clear, dull pearly grey, good light reflex, no ear canal erythema/irritation.  minimal cerumen

## 2010-03-15 NOTE — Assessment & Plan Note (Signed)
Vital Signs:  Patient Profile:   50 Years Old Male Height:     69.75 inches Weight:      219.4 pounds Temp:     98.3 degrees F oral Pulse rate:   72 / minute BP sitting:   108 / 66               Visit Type:  Establishing   History of Present Illness: Back problems about 1 year ago.  Saw Dr. Lacie Scotts. Chol/trig high.  Testosterone low Chol 282/300/274.  Tried lipitor, zetia, vytorin, fish oil Pepcid before meals for GER, nexium also  Last week-- dull chest pain and clammy. Better after rest. Just sitting around Low sugar spells at times--doesn't eat regularly. 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 No blackouts   Review of Systems  CV      Complains of near fainting.      Denies palpitations.      Felt dizz y the night before having the chest pain  Resp      Complains of chest discomfort, cough, and wheezing.      May get SOB if chopping wood  GI      Complains of indigestion.      Heartburn controlled with nexium  MS      R knee stiffness. Now eith lateral pain and numbness since on knees painting more  Psych      Complains of anxiety.      Stress with uncertainty with lay off in 2006 and decreased physical  activity.  Anxious about work!   Physical Exam  General:     alert and well-developed.   Mouth:     good dentition and pharynx pink and moist.   Neck:     supple and full ROM.   Lungs:     normal respiratory effort and normal breath sounds.   Heart:     normal rate, regular rhythm, and no murmur.   Abdomen:     soft, non-tender, no hepatomegaly, and no splenomegaly.   Msk:     R knee-- no joint deformities and no joint instability.  Mild crepitus Pulses:     R dorsalis pedis normal, R carotid normal, L dorsalis pedis normal, and L carotid normal.   Neurologic:     alert &  oriented X3.     Problems:  Medical Problems Added: 1)  Dx of Chest Pain  (ICD-786.50) 2)  Dx of Hyperlipidemia  (ICD-272.4) 3)  Dx of Genella Rife  (ICD-530.81)   Impression & Recommendations:  Problem # 1:  CHEST PAIN (ICD-786.50) Assessment: New History slightly worrisome except at rest.  Single episode EKG normal. P: check records     Stress test if recurs     Discussed "911" Recheck for PE 2-3 mo  Problem # 2:  HYPERLIPIDEMIA (ICD-272.4) Assessment: Unchanged Re-check BW.  Consider meds again unless stress test is done and normal  Problem # 3:  GERD (ICD-530.81) Assessment: Unchanged Okay on nexium

## 2010-03-15 NOTE — Letter (Signed)
Summary: [facility]  [facility]   Imported By: Lanelle Bal 01/04/2009 09:45:42  _____________________________________________________________________  External Attachment:    Type:   Image     Comment:   External Document  Appended Document: [facility] left 3rd MCP injected

## 2010-03-15 NOTE — Assessment & Plan Note (Signed)
Summary: CPX/RBH   Vital Signs:  Patient profile:   50 year old male Weight:      220 pounds Temp:     98.1 degrees F oral Pulse rate:   64 / minute Pulse rhythm:   regular BP sitting:   122 / 90  (left arm) Cuff size:   large  Vitals Entered By: Mervin Hack CMA Duncan Dull) (Jul 07, 2009 8:28 AM) CC: adult physical   History of Present Illness: Doing well  Never came back with diagnosis had left 3rd MCP injected with cortisone--now doing better  Work is booming  Marriage is still together--"it could be better" ongoing issues with wife still Still uses the xanax at [facility] late and can't settle down  Quit smoking Dec 2010 noticed some pain along medial right thigh and down calf that was worse when he smoked   Preventive Screening-Counseling & Management  Alcohol-Tobacco     Smoking Status: quit  Allergies: 1)  Codeine Sulfate (Codeine Sulfate)  Past History:  Past medical, surgical, family and social histories (including risk factors) reviewed for relevance to current acute and chronic problems.  Past Medical History: Reviewed history from 04/23/2006 and no changes required. 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 Eyes:  Denies double vision and vision loss-1 eye. ENT:  Denies decreased hearing and ringing in ears; teeth okay---sees dentist. CV:  Complains of chest pain or discomfort;  denies difficulty breathing at night, difficulty breathing while lying down, fainting, lightheadness, palpitations, and shortness of breath with exertion; 1 episode of chest pain while working---very sharp. Took a few deep breaths and resolved after 10-15 minutes. No recurrence. Resp:  Denies cough and shortness of breath. GI:  Complains of indigestion; denies abdominal pain, bloody stools, change in bowel habits, dark tarry stools, nausea, and vomiting; uses nexium as needed and this controls symptoms. GU:  Denies erectile dysfunction, urinary frequency, and urinary hesitancy; occ right testicular discomfort---?related to thigh pain. MS:  Complains of joint pain; denies joint swelling; occ knee aching. Derm:  Denies lesion(s) and rash. Neuro:  Denies headaches, numbness, tingling, and weakness. Psych:  Complains of anxiety; denies depression; occ anxiety--nothing striking. Heme:  Denies abnormal bruising and enlarge lymph nodes. Allergy:  Complains of seasonal allergies and sneezing; mild pollen symptoms--no meds.  Physical Exam  General:  alert and normal appearance.   Eyes:  pupils equal, pupils round, pupils reactive to light, and no optic disk abnormalities.   Ears:  R ear normal and L ear normal.   Mouth:  no erythema, no exudates, and no lesions.   Neck:  supple, no masses, no thyromegaly, no carotid bruits, and no cervical lymphadenopathy.   Lungs:  normal respiratory effort and normal breath sounds.   Heart:  normal rate, regular rhythm, no murmur, and no gallop.   Abdomen:  soft, non-tender, and no inguinal hernia.   Genitalia:  no hydrocele, no varicocele, no scrotal masses, and no testicular masses or atrophy.  Msk:  no joint tenderness and no joint swelling.   Pulses:  normal in feet Extremities:  no edema Neurologic:  alert & oriented X3, strength normal in all extremities, and gait normal.   Skin:  no rashes and no suspicious lesions.   Axillary Nodes:  No palpable  lymphadenopathy Psych:  normally interactive, good eye contact, not anxious appearing, and not depressed appearing.     Impression & Recommendations:  Problem # 1:  HEALTH MAINTENANCE EXAM (ICD-V70.0) Assessment Comment Only cancer screening next year Quit smoking!!  Problem # 2:  CHEST PAIN (ICD-786.50) Assessment: New  single episode overall seems very low risk for ischemia observe only  Orders: TLB-Renal Function Panel (80069-RENAL) TLB-CBC Platelet - w/Differential (85025-CBCD) TLB-TSH (Thyroid Stimulating Hormone) (84443-TSH) EKG w/ Interpretation (93000)  Problem # 3:  GERD (ICD-530.81) Assessment: Unchanged okay with as needed med  His updated medication list for this problem includes:    Nexium 40 Mg Pack (Esomeprazole magnesium) .Marland Kitchen... 1 daily  Problem # 4:  HYPERLIPIDEMIA (ICD-272.4) Assessment: Unchanged  on fish oil  will recheck  Labs Reviewed: SGOT: 21 (11/19/2008)   SGPT: 25 (11/19/2008)   HDL:43.20 (07/01/2008), 43.3 (08/31/2006)  LDL:DEL (08/31/2006), DEL (04/23/2006)  Chol:309 (07/01/2008), 280 (08/31/2006)  Trig:496.0 (07/01/2008), 167 (08/31/2006)  Orders: TLB-Lipid Panel (80061-LIPID) TLB-Hepatic/Liver Function Pnl (80076-HEPATIC) Venipuncture (09811)  Complete Medication List: 1)  Nexium 40 Mg Pack (Esomeprazole magnesium) .Marland Kitchen.. 1 daily 2)  Omega-3-6-9 Caps (Omega 3-6-9 fatty acids) .... Take 1 capsule by mouth once a day 3)  Alprazolam 0.5 Mg Tabs (Alprazolam) .... Take 1 by mouth two times a day as needed for nerves  Patient Instructions: 1)  Please schedule a follow-up appointment in 1 year.  Prescriptions: ALPRAZOLAM 0.5 MG TABS (ALPRAZOLAM) take 1 by mouth two times a day as needed for nerves  #60 x 0   Entered and Authorized by:   Cindee Salt MD   Signed by:   Cindee Salt MD on 07/07/2009   Method used:   Print then Give to Patient   RxID:   [id number]   Current Allergies (reviewed today): CODEINE SULFATE  (CODEINE SULFATE)  EKG  Procedure date:  07/07/2009  Findings:      sinus at 68 normal

## 2010-03-17 NOTE — Letter (Signed)
Summary: Jearld Adjutant MD/[facility]  Jearld Adjutant MD/[facility]   Imported By: Lester Keystone 03/08/2010 09:25:21  _____________________________________________________________________  External Attachment:    Type:   Image     Comment:   External Document  Appended Document: Jearld Adjutant MD/[facility] synovitis improved after injection

## 2010-07-04 ENCOUNTER — Encounter: Payer: Self-pay | Admitting: Internal Medicine

## 2010-07-05 ENCOUNTER — Encounter: Payer: Self-pay | Admitting: Internal Medicine

## 2010-07-05 ENCOUNTER — Ambulatory Visit (INDEPENDENT_AMBULATORY_CARE_PROVIDER_SITE_OTHER): Payer: 59 | Admitting: Internal Medicine

## 2010-07-05 VITALS — BP 120/80 | HR 80 | Temp 98.0°F | Ht 70.0 in | Wt 221.0 lb

## 2010-07-05 DIAGNOSIS — Z125 Encounter for screening for malignant neoplasm of prostate: Secondary | ICD-10-CM

## 2010-07-05 DIAGNOSIS — M19049 Primary osteoarthritis, unspecified hand: Secondary | ICD-10-CM

## 2010-07-05 DIAGNOSIS — E785 Hyperlipidemia, unspecified: Secondary | ICD-10-CM

## 2010-07-05 DIAGNOSIS — F341 Dysthymic disorder: Secondary | ICD-10-CM

## 2010-07-05 DIAGNOSIS — K219 Gastro-esophageal reflux disease without esophagitis: Secondary | ICD-10-CM

## 2010-07-05 DIAGNOSIS — Z1211 Encounter for screening for malignant neoplasm of colon: Secondary | ICD-10-CM

## 2010-07-05 DIAGNOSIS — Z0000 Encounter for general adult medical examination without abnormal findings: Secondary | ICD-10-CM | POA: Insufficient documentation

## 2010-07-05 LAB — LIPID PANEL
Cholesterol: 281 mg/dL — ABNORMAL HIGH (ref 0–200)
HDL: 45.1 mg/dL (ref >39.00)
Total CHOL/HDL Ratio: 6
Triglycerides: 396 mg/dL — ABNORMAL HIGH (ref 0.0–149.0)
VLDL: 79.2 mg/dL — ABNORMAL HIGH (ref 0.0–40.0)

## 2010-07-05 LAB — PSA: PSA: 0.35 ng/mL (ref 0.10–4.00)

## 2010-07-05 LAB — HEPATIC FUNCTION PANEL
ALT: 26 U/L (ref 0–53)
AST: 23 U/L (ref 0–37)
Albumin: 4 g/dL (ref 3.5–5.2)
Alkaline Phosphatase: 62 U/L (ref 39–117)
Bilirubin, Direct: 0 mg/dL (ref 0.0–0.3)
Total Bilirubin: 0.6 mg/dL (ref 0.3–1.2)
Total Protein: 6.9 g/dL (ref 6.0–8.3)

## 2010-07-05 LAB — CBC WITH DIFFERENTIAL/PLATELET
Basophils Absolute: 0 10*3/uL (ref 0.0–0.1)
Basophils Relative: 0.6 % (ref 0.0–3.0)
Eosinophils Absolute: 0.2 10*3/uL (ref 0.0–0.7)
Eosinophils Relative: 2.8 % (ref 0.0–5.0)
HCT: 41.5 % (ref 39.0–52.0)
Hemoglobin: 14.6 g/dL (ref 13.0–17.0)
Lymphocytes Relative: 33 % (ref 12.0–46.0)
Lymphs Abs: 2 10*3/uL (ref 0.7–4.0)
MCHC: 35.3 g/dL (ref 30.0–36.0)
MCV: 95.6 fl (ref 78.0–100.0)
Monocytes Absolute: 0.5 10*3/uL (ref 0.1–1.0)
Monocytes Relative: 7.8 % (ref 3.0–12.0)
Neutro Abs: 3.4 10*3/uL (ref 1.4–7.7)
Neutrophils Relative %: 55.8 % (ref 43.0–77.0)
Platelets: 200 10*3/uL (ref 150.0–400.0)
RBC: 4.34 Mil/uL (ref 4.22–5.81)
RDW: 12.8 % (ref 11.5–14.6)
WBC: 6 10*3/uL (ref 4.5–10.5)

## 2010-07-05 LAB — BASIC METABOLIC PANEL
BUN: 18 mg/dL (ref 6–23)
CO2: 27 mEq/L (ref 19–32)
Calcium: 9.6 mg/dL (ref 8.4–10.5)
Chloride: 107 mEq/L (ref 96–112)
Creatinine, Ser: 1.2 mg/dL (ref 0.4–1.5)
GFR: 68.69 mL/min (ref >60.00)
Glucose, Bld: 106 mg/dL — ABNORMAL HIGH (ref 70–99)
Potassium: 4.8 mEq/L (ref 3.5–5.1)
Sodium: 139 mEq/L (ref 135–145)

## 2010-07-05 LAB — TSH: TSH: 2.09 u[IU]/mL (ref 0.35–5.50)

## 2010-07-05 LAB — LDL CHOLESTEROL, DIRECT: Direct LDL: 183.8 mg/dL

## 2010-07-05 MED ORDER — ALPRAZOLAM 0.5 MG PO TABS: ORAL_TABLET | ORAL | Status: DC

## 2010-07-05 NOTE — Progress Notes (Signed)
Subjective:    Patient ID: [facility], male    DOB: August 24, 1960, 50 y.o.   MRN: [id number]  HPI Having hand problems again Dr Gavin Potters did MRI eventually and then cortisone shot Went to Dr Renae Fickle for second opinion Rx which "didn't agree with him"====stomach pain Got 2 more shots--3rd MCP and 2nd MCP medially Constant aching Doesn't limit him at work but grip strength is less Hard when he lets go of  Using advil, aleve not helpful. Uses 600mg  often once a day Active in garden, cutting wood, etc  Had testosterone level low in 2007 Wonders about it now Not really fatigued but is concerned No sig ED Does have some trouble initiating sleep and occ trouble after night awakening Uses 1/2 xanax occ and this helps  Past Medical History  Diagnosis Date  . GERD (gastroesophageal reflux disease)   . Hyperlipemia   . History of MRSA infection     after chain saw accident    No past surgical history on file.  Family History  Problem Relation Age of Onset  . Hypertension Mother   . Arthritis Maternal Grandmother     rheumatoid  . Arthritis Paternal Grandmother     rheumatoid  . Arthritis Paternal Grandfather     rheumatoid    History   Social History  . Marital Status: Married    Spouse Name: N/A    Number of Children: 3  . Years of Education: N/A   Occupational History  . 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 Smoking status: Former Games developer  . Smokeless tobacco: Not on file   Comment: quit 12/10  . Alcohol Use: Not on file  . Drug Use: Not on file  . Sexually Active: Not on file   Other Topics Concern  . Not on file   Social History Narrative  . No narrative on file    Current outpatient prescriptions:ALPRAZolam (XANAX) 0.5 MG tablet, Take 1 by mouth two times a day as needed for nerves , Disp: , Rfl: ;  esomeprazole (NEXIUM) 40 MG capsule, Take 40 mg by mouth  daily.  , Disp: , Rfl: ;  Omega 3-6-9 Fatty Acids (OMEGA 3-6-9 COMPLEX PO), Take by mouth daily.  , Disp: , Rfl:   Review of Systems  Constitutional: Positive for fatigue. Negative for unexpected weight change.       Wears seat belt occ feeling of being drained--better over time (candy bar seems to help)  HENT: Negative for hearing loss, congestion, rhinorrhea, dental problem and tinnitus.        Regular with dentisit  Eyes: Negative for visual disturbance.       No diplopia or focal vision losses Has glasses--still not using much except TV and reading  Respiratory: Negative for cough, chest tightness and shortness of breath.   Cardiovascular: Negative for chest pain, palpitations and leg swelling.  Gastrointestinal: Negative for nausea, vomiting, constipation and blood in stool.       No heartburn on med  Genitourinary: Negative for dysuria, urgency, decreased urine volume and difficulty urinating.  Musculoskeletal: Positive for arthralgias. Negative for back pain and joint swelling.       Hand pain and mild in knees  Skin: Negative for rash.       No suspicious areas Small round raised areas on arms  Neurological: Negative for dizziness, syncope, weakness, numbness  and headaches.  Psychiatric/Behavioral: Positive for sleep disturbance. Negative for dysphoric mood. The patient is not nervous/anxious.        Objective:   Physical Exam  Constitutional: He appears well-developed and well-nourished. No distress.  HENT:  Head: Normocephalic and atraumatic.  Right Ear: External ear normal.  Left Ear: External ear normal.  Mouth/Throat: Oropharynx is clear and moist. No oropharyngeal exudate.       TMs negative  Eyes: Conjunctivae and EOM are normal. Pupils are equal, round, and reactive to light.       Fundi benign  Neck: Normal range of motion. Neck supple. No thyromegaly present.  Cardiovascular: Normal rate, regular rhythm, normal heart sounds and intact distal pulses.  Exam  reveals no gallop.   No murmur heard. Pulmonary/Chest: Effort normal. No respiratory distress. He has no wheezes. He has no rales.  Abdominal: Soft. There is no tenderness.  Musculoskeletal: Normal range of motion. He exhibits no edema and no tenderness.       Mild swelling in some hand MCPs but no active synovitis  Lymphadenopathy:    He has no cervical adenopathy.  Neurological: He is alert. He exhibits normal muscle tone.       No weakness  Skin: No rash noted.       Few small keratotic papules No worrisome lesions  Psychiatric: He has a normal mood and affect. His behavior is normal. Judgment and thought content normal.          Assessment & Plan:

## 2010-07-05 NOTE — Patient Instructions (Signed)
Please set up colonoscopy

## 2010-07-07 ENCOUNTER — Telehealth: Payer: Self-pay

## 2010-07-07 NOTE — Telephone Encounter (Signed)
Message copied by Patience Musca on Thu Jul 07, 2010  7:00 PM ------      Message from: Tillman Abide I      Created: Thu Jul 07, 2010  1:33 PM       Blood work is fine      Blood sugar is again slightly elevated at 106. He just needs to work on fitness and keep his weight down      CHol is still elevated with total of 281 and LDL or bad chol of 183. He should try to be consistent with the fish oil      Blood count, liver, kidney and thyroid are all normal

## 2010-07-07 NOTE — Telephone Encounter (Signed)
Patient notified as instructed by telephone. Also let pt know his prostate was normal . Pt said he spoke with Dr Alphonsus Sias about checking a testosterone level and wondered if that test was done also. Pt advised Dr Alphonsus Sias will not be in office until next Tues 07/12/10 and pt said no rush could wait to next week for answer. No problem.

## 2010-07-07 NOTE — Telephone Encounter (Signed)
Patient notified as instructed by telephone. 

## 2010-07-07 NOTE — Telephone Encounter (Signed)
Message copied by Patience Musca on Thu Jul 07, 2010  7:04 PM ------      Message from: Tillman Abide I      Created: Thu Jul 07, 2010  1:36 PM       Rena,            Please let him know his prostate was normal also            Thanks            Luan Pulling

## 2010-07-10 NOTE — Telephone Encounter (Signed)
Thanks

## 2010-08-22 ENCOUNTER — Ambulatory Visit (AMBULATORY_SURGERY_CENTER): Payer: 59 | Admitting: *Deleted

## 2010-08-22 VITALS — Ht 70.5 in | Wt 218.9 lb

## 2010-08-22 DIAGNOSIS — Z1211 Encounter for screening for malignant neoplasm of colon: Secondary | ICD-10-CM

## 2010-08-22 MED ORDER — PEG-KCL-NACL-NASULF-NA ASC-C 100 G PO SOLR: 1.0000 | Freq: Once | ORAL | Status: DC

## 2010-08-22 NOTE — Progress Notes (Signed)
Pt states, "I can drink up to a case of beer a week.  I just do when it is really hot out."

## 2010-09-05 ENCOUNTER — Ambulatory Visit (AMBULATORY_SURGERY_CENTER): Payer: 59 | Admitting: Internal Medicine

## 2010-09-05 ENCOUNTER — Encounter: Payer: Self-pay | Admitting: Internal Medicine

## 2010-09-05 VITALS — BP 130/80 | HR 61 | Temp 97.0°F | Resp 14 | Ht 70.0 in | Wt 218.0 lb

## 2010-09-05 DIAGNOSIS — Z1211 Encounter for screening for malignant neoplasm of colon: Secondary | ICD-10-CM

## 2010-09-05 HISTORY — PX: COLONOSCOPY: SHX174

## 2010-09-05 MED ORDER — SODIUM CHLORIDE 0.9 % IV SOLN: 500.0000 mL | INTRAVENOUS | Status: DC

## 2010-09-05 NOTE — Patient Instructions (Addendum)
The colonoscopy was normal. Unless you develop a family history of colon cancer in a close relative or have signs or symptoms of colon problems, your next routine colonoscopy should be in about 10 years, 2022. If a physician advises routine screening stool cards looking for blood, I would not start those until about 5-6 years from now, which would be 2017-18. You may actually wait 10 years until any routine colorectal cancer screening begins again. Iva Boop, MD, [facility]  Please review all discharge papers given to you by the recovery room nurse.  If you have any problems today after discharge please call [phone]. We will call you in the am to see how you are doing and answer any questions you may have. Thank you.

## 2010-09-06 ENCOUNTER — Telehealth: Payer: Self-pay | Admitting: *Deleted

## 2010-09-06 NOTE — Telephone Encounter (Signed)

## 2010-10-03 ENCOUNTER — Other Ambulatory Visit: Payer: Self-pay | Admitting: *Deleted

## 2010-10-03 MED ORDER — ESOMEPRAZOLE MAGNESIUM 40 MG PO CPDR: 40.0000 mg | DELAYED_RELEASE_CAPSULE | Freq: Every day | ORAL | Status: DC

## 2010-10-07 ENCOUNTER — Telehealth: Payer: Self-pay | Admitting: Radiology

## 2010-10-07 ENCOUNTER — Other Ambulatory Visit: Payer: Self-pay

## 2010-10-07 ENCOUNTER — Encounter: Payer: Self-pay | Admitting: Internal Medicine

## 2010-10-07 ENCOUNTER — Ambulatory Visit (INDEPENDENT_AMBULATORY_CARE_PROVIDER_SITE_OTHER)
Admission: RE | Admit: 2010-10-07 | Discharge: 2010-10-07 | Disposition: A | Discharge status: Home / Self Care | Payer: 59 | Source: Ambulatory Visit | Attending: Internal Medicine | Admitting: Internal Medicine

## 2010-10-07 ENCOUNTER — Ambulatory Visit (INDEPENDENT_AMBULATORY_CARE_PROVIDER_SITE_OTHER): Payer: 59 | Admitting: Internal Medicine

## 2010-10-07 VITALS — BP 110/65 | HR 95 | Temp 98.7°F | Ht 70.0 in | Wt 216.0 lb

## 2010-10-07 DIAGNOSIS — R05 Cough: Secondary | ICD-10-CM

## 2010-10-07 DIAGNOSIS — J209 Acute bronchitis, unspecified: Secondary | ICD-10-CM

## 2010-10-07 DIAGNOSIS — R059 Cough, unspecified: Secondary | ICD-10-CM

## 2010-10-07 DIAGNOSIS — J189 Pneumonia, unspecified organism: Secondary | ICD-10-CM

## 2010-10-07 MED ORDER — TRAMADOL HCL 50 MG PO TABS: 50.0000 mg | ORAL_TABLET | Freq: Every evening | ORAL | Status: AC | PRN

## 2010-10-07 NOTE — Assessment & Plan Note (Signed)
Seems to be viral Mild PE findings prompted the CXR but this looks normal Discussed that antibiotics won't help Will try tramadol for cough and headache Call next week if not clearly

## 2010-10-07 NOTE — Telephone Encounter (Signed)
Reviewed with patient Will start levaquin

## 2010-10-07 NOTE — Progress Notes (Signed)
  Subjective:    Patient ID: Gabriel [name], male    DOB: 1960-05-31, 50 y.o.   MRN: [id number]  HPI Sick since 2 days ago Tight in his chest  Cough with just a little mucus Hurts head with cough---very rough. Has to hold his head to support during cough Temp 100.9 last night  Gets tight feeling "like it just stops" at end of deep inspiration  No sore throat No ear pain No sig head congestion  Current Outpatient Prescriptions on File Prior to Visit  Medication Sig Dispense Refill  . ALPRAZolam (XANAX) 0.5 MG tablet Take 1 by mouth two times a day as needed for nerves  60 tablet  0  . esomeprazole (NEXIUM) 40 MG capsule Take 1 capsule (40 mg total) by mouth daily.  30 capsule  11  . Ibuprofen (ADVIL PO) Take 4 tablets by mouth as needed.       . Omega 3-6-9 Fatty Acids (OMEGA 3-6-9 COMPLEX PO) Take by mouth daily.         Current Facility-Administered Medications on File Prior to Visit  Medication Dose Route Frequency Provider Last Rate Last Dose  . 0.9 %  sodium chloride infusion  500 mL Intravenous Continuous Iva Boop, MD        Allergies  Allergen Reactions  . Codeine Sulfate     REACTION: Itching    Past Medical History  Diagnosis Date  . GERD (gastroesophageal reflux disease)   . Hyperlipemia   . History of MRSA infection     after chain saw accident  . Arthritis     left hand    Past Surgical History  Procedure Date  . Colonoscopy 09/05/10    normal    Family History  Problem Relation Age of Onset  . Hypertension Mother   . Arthritis Maternal Grandmother     rheumatoid  . Arthritis Paternal Grandmother     rheumatoid  . Arthritis Paternal Grandfather     rheumatoid  . Colon cancer Neg Hx     History   Social History  . Marital Status: Married    Spouse Name: N/A    Number of Children: 3  . Years of Education: N/A   Occupational History  . 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 Smoking status: Former Games developer  . Smokeless tobacco: Never Used   Comment: quit 12/10  . Alcohol Use: 14.4 oz/week    24 Cans of beer per week  . Drug Use: No  . Sexually Active: Not on file   Other Topics Concern  . Not on file   Social History Narrative  . No narrative on file   Review of Systems Slight nausea No diarrhea No vomiting Appetite is off some    Objective:   Physical Exam  Constitutional: He appears well-developed and well-nourished. No distress.  HENT:  Mouth/Throat: Oropharynx is clear and moist. No oropharyngeal exudate.       TMs normal Mild nasal congestion No sinus tenderness  Neck: Normal range of motion. Neck supple.  Pulmonary/Chest: Effort normal. No respiratory distress. He has no wheezes. He has no rales.       Dullness at left base Basilar rhonchi---??slight crackles  Lymphadenopathy:    He has no cervical adenopathy.          Assessment & Plan:

## 2010-10-07 NOTE — Assessment & Plan Note (Signed)
Radiologist reports that the CXR may show LLL pneumonia reveiwed with patient Levaquin 500mg  daily [place] to [place] (#10 x 0)

## 2010-10-07 NOTE — Telephone Encounter (Signed)
[facility] called xray report,Left lower lobe patchy opacity consistent with pneumonia. I verbally gave this report to Dr Alphonsus Sias, 9:19 am 10-07-2010.

## 2010-10-07 NOTE — Patient Instructions (Signed)
Please call next week if you are not clearing, or if you get worse

## 2011-03-13 ENCOUNTER — Ambulatory Visit (INDEPENDENT_AMBULATORY_CARE_PROVIDER_SITE_OTHER): Payer: 59 | Admitting: Internal Medicine

## 2011-03-13 ENCOUNTER — Encounter: Payer: Self-pay | Admitting: Internal Medicine

## 2011-03-13 DIAGNOSIS — G4700 Insomnia, unspecified: Secondary | ICD-10-CM

## 2011-03-13 DIAGNOSIS — H8109 Meniere's disease, unspecified ear: Secondary | ICD-10-CM

## 2011-03-13 DIAGNOSIS — H81399 Other peripheral vertigo, unspecified ear: Secondary | ICD-10-CM

## 2011-03-13 MED ORDER — MECLIZINE HCL 25 MG PO TABS: 25.0000 mg | ORAL_TABLET | Freq: Three times a day (TID) | ORAL | Status: AC | PRN

## 2011-03-13 MED ORDER — ALPRAZOLAM 0.5 MG PO TABS: ORAL_TABLET | ORAL | Status: DC

## 2011-03-13 NOTE — Progress Notes (Signed)
Subjective:    Patient ID: [facility], male    DOB: 1960-03-08, 51 y.o.   MRN: [id number]  HPI Awoke to use bathroom 3 nights ago When he lie down again---had spinning vertigo Nausea, balance problems, etc Still went to work  Did get some better over the weekend Some discomfort in left ear  Slight tinnitus No acute change in hearing No fever No feeling of sickness  Worst when first trying to get up in AM---trying to get out of bed Can get milder symptoms during the day when he turns his head  Current Outpatient Prescriptions on File Prior to Visit  Medication Sig Dispense Refill  . ALPRAZolam (XANAX) 0.5 MG tablet Take 1 by mouth two times a day as needed for nerves  60 tablet  0  . esomeprazole (NEXIUM) 40 MG capsule Take 1 capsule (40 mg total) by mouth daily.  30 capsule  11  . Omega 3-6-9 Fatty Acids (OMEGA 3-6-9 COMPLEX PO) Take by mouth daily.         Current Facility-Administered Medications on File Prior to Visit  Medication Dose Route Frequency Provider Last Rate Last Dose  . 0.9 %  sodium chloride infusion  500 mL Intravenous Continuous Iva Boop, MD        Allergies  Allergen Reactions  . Codeine Sulfate     REACTION: Itching    Past Medical History  Diagnosis Date  . GERD (gastroesophageal reflux disease)   . Hyperlipemia   . History of MRSA infection     after chain saw accident  . Arthritis     left hand    Past Surgical History  Procedure Date  . Colonoscopy 09/05/10    normal    Family History  Problem Relation Age of Onset  . Hypertension Mother   . Arthritis Maternal Grandmother     rheumatoid  . Arthritis Paternal Grandmother     rheumatoid  . Arthritis Paternal Grandfather     rheumatoid  . Colon cancer Neg Hx     History   Social History  . Marital Status: Married    Spouse Name: N/A    Number of Children: 3  . Years of Education: N/A   Occupational History  . 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 Smoking status: Former Games developer  . Smokeless tobacco: Never Used   Comment: quit 12/10  . Alcohol Use: 14.4 oz/week    24 Cans of beer per week  . Drug Use: No  . Sexually Active: Not on file   Other Topics Concern  . Not on file   Social History Narrative  . No narrative on file   Review of Systems May have had similar spell in past Eating okay No vomiting or diarrhea     Objective:   Physical Exam  Constitutional: He appears well-developed and well-nourished. No distress.  HENT:  Mouth/Throat: Oropharynx is clear and moist. No oropharyngeal exudate.       TMs normal  Eyes: Conjunctivae and EOM are normal. Pupils are equal, round, and reactive to light.       fuhdi benign Horizontal nystagmus with lateral gaze bilaterally  Neurological: He is alert. He has normal strength. He displays no atrophy and no tremor. No cranial nerve deficit. He exhibits normal muscle tone. He displays a negative Romberg sign. Coordination and gait normal.  Psychiatric: He has a normal mood and affect. His behavior is normal. Judgment and thought content normal.          Assessment & Plan:

## 2011-03-13 NOTE — Assessment & Plan Note (Signed)
Still uses xanax mostly at bedtime when anxiety acts up

## 2011-03-13 NOTE — Patient Instructions (Signed)
Please use the meclizine three times a day till the vertigo is better. Then you can wean off it over a couple of days to be sure your symptoms don't return

## 2011-03-13 NOTE — Assessment & Plan Note (Signed)
Clear seems to be vestibular No neuro findings Reassured Will Rx meclizine to use till the symptoms abate

## 2011-03-14 ENCOUNTER — Ambulatory Visit: Payer: 59 | Admitting: Internal Medicine

## 2011-03-16 ENCOUNTER — Telehealth: Payer: Self-pay | Admitting: Internal Medicine

## 2011-03-16 NOTE — Telephone Encounter (Signed)
Has been using the meclizine but still dizziness Mostly with head movement Will plan to refer to ENT if not better by Monday--he will call tomorrow afternoon if he is not any better---to set up for Monday referral

## 2011-03-16 NOTE — Telephone Encounter (Signed)
Pt called says he is not feeling any better, he is still experiencing dizziness. Pt asked if his problem could be related to the ear? No pain in ear, just asking.   Pt says he is not able to work, still  Dizzy, when  laying, sitting and  walking.   [facility]-  Call back # [phone]

## 2011-04-11 ENCOUNTER — Telehealth: Payer: Self-pay | Admitting: *Deleted

## 2011-04-11 NOTE — Telephone Encounter (Signed)
Form done 

## 2011-04-11 NOTE — Telephone Encounter (Signed)
Form faxed back to [facility]

## 2011-04-11 NOTE — Telephone Encounter (Signed)
Received fax from [facility] stating that PA is needed for Nexium.  Called [phone] [facility] and requested form, form in your IN box.

## 2011-04-12 NOTE — Telephone Encounter (Signed)
Received PA approval for Nexium.  Approved from 04/12/2011 through 04/11/2014.  Patient and pharmacy notified.

## 2011-06-15 ENCOUNTER — Encounter: Payer: 59 | Admitting: Internal Medicine

## 2011-07-14 ENCOUNTER — Encounter: Payer: 59 | Admitting: Internal Medicine

## 2011-08-04 ENCOUNTER — Encounter: Payer: 59 | Admitting: Internal Medicine

## 2011-08-04 DIAGNOSIS — Z0289 Encounter for other administrative examinations: Secondary | ICD-10-CM

## 2011-10-26 ENCOUNTER — Other Ambulatory Visit: Payer: Self-pay | Admitting: *Deleted

## 2011-10-26 MED ORDER — ESOMEPRAZOLE MAGNESIUM 40 MG PO CPDR: 40.0000 mg | DELAYED_RELEASE_CAPSULE | Freq: Every day | ORAL | Status: DC

## 2011-10-26 NOTE — Telephone Encounter (Signed)
Pt has several canceled cpx appt's and a cpx no show

## 2012-05-17 ENCOUNTER — Ambulatory Visit: Payer: 59 | Admitting: Internal Medicine

## 2012-05-17 ENCOUNTER — Encounter: Payer: Self-pay | Admitting: Internal Medicine

## 2012-05-17 ENCOUNTER — Ambulatory Visit (INDEPENDENT_AMBULATORY_CARE_PROVIDER_SITE_OTHER): Payer: 59 | Admitting: Internal Medicine

## 2012-05-17 VITALS — BP 132/80 | HR 98 | Temp 100.4°F | Wt 218.0 lb

## 2012-05-17 DIAGNOSIS — J019 Acute sinusitis, unspecified: Secondary | ICD-10-CM

## 2012-05-17 MED ORDER — HYDROCODONE-HOMATROPINE 5-1.5 MG/5ML PO SYRP: 5.0000 mL | ORAL_SOLUTION | Freq: Every evening | ORAL | Status: DC | PRN

## 2012-05-17 NOTE — Assessment & Plan Note (Signed)
Sounds like viral infection Discussed supportive care Hycodan for cough If worsens next week, will send Rx for amoxicillin

## 2012-05-17 NOTE — Progress Notes (Signed)
  Subjective:    Patient ID: Gabriel [name], male    DOB: [birth year], 52 y.o.   MRN: [id number]  HPI Has been sick with aching, head congestion Cough---tight at times Stimulated by deep breath with mouth open  Started 2-3 days ago with slight cough Feels feverish Dry cough Some headache--frontal and maxillary No drainage as yet but some sneezing--mild rhinorrhea No sore throat Some sharp ear pains--intermittent Slight SOB Hasn't missed work  Tried 2 mucinex and a decongestant--not much help nyquil helped him sleep  Current Outpatient Prescriptions on File Prior to Visit  Medication Sig Dispense Refill  . ALPRAZolam (XANAX) 0.5 MG tablet Take 1 by mouth two times a day as needed for nerves  60 tablet  0  . Omega 3-6-9 Fatty Acids (OMEGA 3-6-9 COMPLEX PO) Take by mouth daily.         Current Facility-Administered Medications on File Prior to Visit  Medication Dose Route Frequency Provider Last Rate Last Dose  . 0.9 %  sodium chloride infusion  500 mL Intravenous Continuous Iva Boop, MD        Allergies  Allergen Reactions  . Codeine Sulfate     REACTION: Itching    Past Medical History  Diagnosis Date  . GERD (gastroesophageal reflux disease)   . Hyperlipemia   . History of MRSA infection     after chain saw accident  . Arthritis     left hand    Past Surgical History  Procedure Laterality Date  . Colonoscopy  09/05/10    normal    Family History  Problem Relation Age of Onset  . Hypertension Mother   . Arthritis Maternal Grandmother     rheumatoid  . Arthritis Paternal Grandmother     rheumatoid  . Arthritis Paternal Grandfather     rheumatoid  . Colon cancer Neg Hx     History   Social History  . Marital Status: Married    Spouse Name: N/A    Number of Children: 3  . Years of Education: N/A   Occupational History  . 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 Smoking status: Former Games developer  . Smokeless tobacco: Never Used     Comment: quit 12/10  . Alcohol Use: 14.4 oz/week    24 Cans of beer per week  . Drug Use: No  . Sexually Active: Not on file   Other Topics Concern  . Not on file   Social History Narrative  . No narrative on file   Review of Systems No rash No vomiting or diarrhea     Objective:   Physical Exam  Constitutional: He appears well-developed and well-nourished. No distress.  HENT:  No sinus tenderness TMs normal Moderate nasal inflammation  Neck: Normal range of motion. Neck supple. No thyromegaly present.  Pulmonary/Chest: Effort normal and breath sounds normal. No respiratory distress. He has no wheezes. He has no rales.  Lymphadenopathy:    He has no cervical adenopathy.          Assessment & Plan:

## 2012-05-17 NOTE — Patient Instructions (Signed)
Please let me know if you are worsening next week

## 2012-05-19 ENCOUNTER — Encounter: Payer: Self-pay | Admitting: Internal Medicine

## 2012-05-20 MED ORDER — AMOXICILLIN 500 MG PO CAPS: 500.0000 mg | ORAL_CAPSULE | Freq: Two times a day (BID) | ORAL | Status: DC

## 2012-05-20 NOTE — Telephone Encounter (Signed)
rx sent to pharmacy by e-script Spoke with patient and advised results   

## 2012-11-15 ENCOUNTER — Encounter: Payer: Self-pay | Admitting: Internal Medicine

## 2012-11-15 ENCOUNTER — Encounter: Payer: Self-pay | Admitting: Radiology

## 2012-11-15 ENCOUNTER — Ambulatory Visit (INDEPENDENT_AMBULATORY_CARE_PROVIDER_SITE_OTHER): Payer: 59 | Admitting: Internal Medicine

## 2012-11-15 VITALS — BP 112/70 | HR 79 | Temp 98.2°F | Ht 70.0 in | Wt 221.0 lb

## 2012-11-15 DIAGNOSIS — Z125 Encounter for screening for malignant neoplasm of prostate: Secondary | ICD-10-CM

## 2012-11-15 DIAGNOSIS — E785 Hyperlipidemia, unspecified: Secondary | ICD-10-CM

## 2012-11-15 DIAGNOSIS — G4700 Insomnia, unspecified: Secondary | ICD-10-CM

## 2012-11-15 DIAGNOSIS — Z0000 Encounter for general adult medical examination without abnormal findings: Secondary | ICD-10-CM

## 2012-11-15 DIAGNOSIS — Z23 Encounter for immunization: Secondary | ICD-10-CM

## 2012-11-15 LAB — CBC WITH DIFFERENTIAL/PLATELET
Basophils Absolute: 0 10*3/uL (ref 0.0–0.1)
Basophils Relative: 0.5 % (ref 0.0–3.0)
Eosinophils Absolute: 0.1 10*3/uL (ref 0.0–0.7)
Eosinophils Relative: 1.8 % (ref 0.0–5.0)
HCT: 40.7 % (ref 39.0–52.0)
Hemoglobin: 14.1 g/dL (ref 13.0–17.0)
Lymphocytes Relative: 31.7 % (ref 12.0–46.0)
Lymphs Abs: 2.1 10*3/uL (ref 0.7–4.0)
MCHC: 34.6 g/dL (ref 30.0–36.0)
MCV: 94.6 fl (ref 78.0–100.0)
Monocytes Absolute: 0.5 10*3/uL (ref 0.1–1.0)
Monocytes Relative: 7.8 % (ref 3.0–12.0)
Neutro Abs: 3.8 10*3/uL (ref 1.4–7.7)
Neutrophils Relative %: 58.2 % (ref 43.0–77.0)
Platelets: 225 10*3/uL (ref 150.0–400.0)
RBC: 4.3 Mil/uL (ref 4.22–5.81)
RDW: 12.6 % (ref 11.5–14.6)
WBC: 6.5 10*3/uL (ref 4.5–10.5)

## 2012-11-15 LAB — BASIC METABOLIC PANEL
BUN: 16 mg/dL (ref 6–23)
CO2: 30 mEq/L (ref 19–32)
Calcium: 9.4 mg/dL (ref 8.4–10.5)
Chloride: 106 mEq/L (ref 96–112)
Creatinine, Ser: 1.2 mg/dL (ref 0.4–1.5)
GFR: 65.5 mL/min (ref >60.00)
Glucose, Bld: 94 mg/dL (ref 70–99)
Potassium: 4.3 mEq/L (ref 3.5–5.1)
Sodium: 139 mEq/L (ref 135–145)

## 2012-11-15 LAB — LIPID PANEL
Cholesterol: 301 mg/dL — ABNORMAL HIGH (ref 0–200)
HDL: 42.4 mg/dL (ref >39.00)
Total CHOL/HDL Ratio: 7
Triglycerides: 351 mg/dL — ABNORMAL HIGH (ref 0.0–149.0)
VLDL: 70.2 mg/dL — ABNORMAL HIGH (ref 0.0–40.0)

## 2012-11-15 LAB — HEPATIC FUNCTION PANEL
ALT: 27 U/L (ref 0–53)
AST: 25 U/L (ref 0–37)
Albumin: 4.3 g/dL (ref 3.5–5.2)
Alkaline Phosphatase: 57 U/L (ref 39–117)
Bilirubin, Direct: 0.1 mg/dL (ref 0.0–0.3)
Total Bilirubin: 0.8 mg/dL (ref 0.3–1.2)
Total Protein: 7.2 g/dL (ref 6.0–8.3)

## 2012-11-15 LAB — TSH: TSH: 1.7 u[IU]/mL (ref 0.35–5.50)

## 2012-11-15 LAB — PSA: PSA: 0.38 ng/mL (ref 0.10–4.00)

## 2012-11-15 MED ORDER — ESOMEPRAZOLE MAGNESIUM 40 MG PO CPDR: 40.0000 mg | DELAYED_RELEASE_CAPSULE | Freq: Every day | ORAL | Status: DC

## 2012-11-15 MED ORDER — ALPRAZOLAM 0.5 MG PO TABS: ORAL_TABLET | ORAL | Status: DC

## 2012-11-15 NOTE — Assessment & Plan Note (Signed)
Discussed primary prevention Will hold off on statin for now

## 2012-11-15 NOTE — Assessment & Plan Note (Signed)
Healthy but has to work on fitness Will check PSA

## 2012-11-15 NOTE — Progress Notes (Signed)
Subjective:    Patient ID: Gabriel [name], male    DOB: February 29, 1960, 52 y.o.   MRN: [id number]  HPI Here for physical Sinus infection did resolve  Has something on his left fingers 2nd and 3rd Causes pain at times Discussed cryotherapy---he wants to avoid this to keep from the pain Will try duct tape first  Still takes the nexium every day This controls his reflux symptoms  Taking fish oil for his joints and his cholesterol  He isn't excited about primary prevention with statins Inconsistent with lifestyle issues  Now on glucosamine/chondroitin for his arthritis  Current Outpatient Prescriptions on File Prior to Visit  Medication Sig Dispense Refill  . ALPRAZolam (XANAX) 0.5 MG tablet Take 1 by mouth two times a day as needed for nerves  60 tablet  0  . Omega 3-6-9 Fatty Acids (OMEGA 3-6-9 COMPLEX PO) Take by mouth daily.         No current facility-administered medications on file prior to visit.    Allergies  Allergen Reactions  . Codeine Sulfate     REACTION: Itching    Past Medical History  Diagnosis Date  . GERD (gastroesophageal reflux disease)   . Hyperlipemia   . History of MRSA infection     after chain saw accident  . Arthritis     left hand    Past Surgical History  Procedure Laterality Date  . Colonoscopy  09/05/10    normal    Family History  Problem Relation Age of Onset  . Hypertension Mother   . Asthma Mother   . Arthritis Maternal Grandmother     rheumatoid  . Arthritis Paternal Grandmother     rheumatoid  . Arthritis Paternal Grandfather     rheumatoid  . Colon cancer Neg Hx   . Obesity Brother   . Hypertension Brother     History   Social History  . Marital Status: Married    Spouse Name: N/A    Number of Children: 3  . Years of Education: N/A   Occupational History  . 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 Smoking status: Former Games developer  . Smokeless tobacco: Never Used     Comment:  quit 12/10  . Alcohol Use: 14.4 oz/week    24 Cans of beer per week  . Drug Use: No  . Sexual Activity: Not on file   Other Topics Concern  . Not on file   Social History Narrative  . No narrative on file   Review of Systems  Constitutional: Negative for fatigue and unexpected weight change.       Wears seat belt  HENT: Positive for congestion, sneezing and dental problem. Negative for hearing loss, rhinorrhea and tinnitus.        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       No diplopia or unilateral vision loss  Respiratory: Negative for cough, chest tightness and shortness of breath.   Cardiovascular: Negative for chest pain, palpitations and leg swelling.  Gastrointestinal: Negative for nausea, vomiting, abdominal pain, constipation and blood in stool.       Heartburn controlled on the nexium  Endocrine: Negative for cold intolerance and heat intolerance.  Genitourinary: Negative for urgency, frequency and difficulty urinating.  No sig sexual problems  Musculoskeletal: Positive for arthralgias. Negative for back pain and joint swelling.       Hands and right knee pain chronically advil helps left hand when bad  Skin: Negative for rash.       Warts on left fingers  Allergic/Immunologic: Negative for environmental allergies and immunocompromised state.  Hematological: Positive for adenopathy. Does not bruise/bleed easily.       Notices occasional cervical nodes with URIs  Psychiatric/Behavioral: Positive for sleep disturbance. Negative for dysphoric mood. The patient is not nervous/anxious.        Chronic sleep problems-- uses xanax rarely       Objective:   Physical Exam  Constitutional: He is oriented to person, place, and time. He appears well-developed and well-nourished. No distress.  HENT:   Head: Normocephalic and atraumatic.  Right Ear: External ear normal.  Left Ear: External ear normal.  Mouth/Throat: Oropharynx is clear and moist. No oropharyngeal exudate.  Eyes: Conjunctivae and EOM are normal. Pupils are equal, round, and reactive to light.  Neck: Normal range of motion. Neck supple. No thyromegaly present.  Cardiovascular: Normal rate, regular rhythm, normal heart sounds and intact distal pulses.  Exam reveals no gallop.   No murmur heard. Pulmonary/Chest: Effort normal and breath sounds normal. No respiratory distress. He has no wheezes. He has no rales.  Abdominal: Soft. There is no tenderness.  Musculoskeletal: He exhibits no edema and no tenderness.  Lymphadenopathy:    He has no cervical adenopathy.  Neurological: He is alert and oriented to person, place, and time.  Skin: No rash noted. No erythema.  1 small wart on each of 2nd and 3rd left fingers  Psychiatric: He has a normal mood and affect. His behavior is normal.          Assessment & Plan:

## 2012-11-15 NOTE — Addendum Note (Signed)
Addended by: Sueanne Margarita on: 11/15/2012 02:26 PM   Modules accepted: Orders

## 2012-11-15 NOTE — Assessment & Plan Note (Signed)
Uses the xanax very occasionally

## 2012-11-18 LAB — LDL CHOLESTEROL, DIRECT: Direct LDL: 174.3 mg/dL

## 2012-11-22 ENCOUNTER — Encounter: Payer: 59 | Admitting: Internal Medicine

## 2012-12-02 ENCOUNTER — Encounter: Payer: Self-pay | Admitting: Internal Medicine

## 2013-11-21 ENCOUNTER — Encounter: Payer: 59 | Admitting: Internal Medicine

## 2013-12-02 ENCOUNTER — Ambulatory Visit (INDEPENDENT_AMBULATORY_CARE_PROVIDER_SITE_OTHER): Payer: 59 | Admitting: Internal Medicine

## 2013-12-02 ENCOUNTER — Encounter: Payer: Self-pay | Admitting: Internal Medicine

## 2013-12-02 VITALS — BP 122/80 | HR 82 | Temp 98.3°F | Ht 70.0 in | Wt 222.0 lb

## 2013-12-02 DIAGNOSIS — Z0000 Encounter for general adult medical examination without abnormal findings: Secondary | ICD-10-CM | POA: Diagnosis not present

## 2013-12-02 DIAGNOSIS — K219 Gastro-esophageal reflux disease without esophagitis: Secondary | ICD-10-CM | POA: Diagnosis not present

## 2013-12-02 DIAGNOSIS — G479 Sleep disorder, unspecified: Secondary | ICD-10-CM

## 2013-12-02 DIAGNOSIS — Z23 Encounter for immunization: Secondary | ICD-10-CM

## 2013-12-02 DIAGNOSIS — E785 Hyperlipidemia, unspecified: Secondary | ICD-10-CM | POA: Diagnosis not present

## 2013-12-02 LAB — COMPREHENSIVE METABOLIC PANEL
ALT: 27 U/L (ref 0–53)
AST: 25 U/L (ref 0–37)
Albumin: 3.9 g/dL (ref 3.5–5.2)
Alkaline Phosphatase: 65 U/L (ref 39–117)
BUN: 17 mg/dL (ref 6–23)
CO2: 21 mEq/L (ref 19–32)
Calcium: 9.8 mg/dL (ref 8.4–10.5)
Chloride: 104 mEq/L (ref 96–112)
Creatinine, Ser: 1.4 mg/dL (ref 0.4–1.5)
GFR: 58.1 mL/min — ABNORMAL LOW (ref >60.00)
Glucose, Bld: 97 mg/dL (ref 70–99)
Potassium: 4.6 mEq/L (ref 3.5–5.1)
Sodium: 140 mEq/L (ref 135–145)
Total Bilirubin: 0.7 mg/dL (ref 0.2–1.2)
Total Protein: 8.2 g/dL (ref 6.0–8.3)

## 2013-12-02 LAB — CBC WITH DIFFERENTIAL/PLATELET
Basophils Absolute: 0 10*3/uL (ref 0.0–0.1)
Basophils Relative: 0.5 % (ref 0.0–3.0)
Eosinophils Absolute: 0.2 10*3/uL (ref 0.0–0.7)
Eosinophils Relative: 2.1 % (ref 0.0–5.0)
HCT: 43.9 % (ref 39.0–52.0)
Hemoglobin: 14.7 g/dL (ref 13.0–17.0)
Lymphocytes Relative: 25.3 % (ref 12.0–46.0)
Lymphs Abs: 1.9 10*3/uL (ref 0.7–4.0)
MCHC: 33.5 g/dL (ref 30.0–36.0)
MCV: 95.9 fl (ref 78.0–100.0)
Monocytes Absolute: 0.6 10*3/uL (ref 0.1–1.0)
Monocytes Relative: 7.6 % (ref 3.0–12.0)
Neutro Abs: 5 10*3/uL (ref 1.4–7.7)
Neutrophils Relative %: 64.5 % (ref 43.0–77.0)
Platelets: 222 10*3/uL (ref 150.0–400.0)
RBC: 4.58 Mil/uL (ref 4.22–5.81)
RDW: 13 % (ref 11.5–15.5)
WBC: 7.7 10*3/uL (ref 4.0–10.5)

## 2013-12-02 LAB — LIPID PANEL
Cholesterol: 299 mg/dL — ABNORMAL HIGH (ref 0–200)
HDL: 41.6 mg/dL (ref >39.00)
NonHDL: 257.4
Total CHOL/HDL Ratio: 7
Triglycerides: 391 mg/dL — ABNORMAL HIGH (ref 0.0–149.0)
VLDL: 78.2 mg/dL — ABNORMAL HIGH (ref 0.0–40.0)

## 2013-12-02 LAB — T4, FREE: Free T4: 0.78 ng/dL (ref 0.60–1.60)

## 2013-12-02 NOTE — Progress Notes (Signed)
Subjective:    Patient ID: Gabriel [name], male    DOB: 1960/05/09, 53 y.o.   MRN: [id number]  HPI Here for physical  Has aching in his hands--wonders if it is due to colder weather Some pain in MCPs--- saw Dr Gavin [facility] but no clear diagnosis Does have bigger grips on roller poles---can try this for his brushes  Concerned about his weight Stable but knows it is too high Does do occasional walking on day off Has cut back on sugared drinks  Current Outpatient Prescriptions on File Prior to Visit  Medication Sig Dispense Refill  . ALPRAZolam (XANAX) 0.5 MG tablet Take 1 by mouth two times a day as needed for nerves  60 tablet  0  . esomeprazole (NEXIUM) 40 MG capsule Take 1 capsule (40 mg total) by mouth daily.  90 capsule  3  . vitamin B-12 (CYANOCOBALAMIN) 1000 MCG tablet Take 1,000 mcg by mouth daily.       No current facility-administered medications on file prior to visit.    Allergies  Allergen Reactions  . Codeine Sulfate     REACTION: Itching    Past Medical History  Diagnosis Date  . GERD (gastroesophageal reflux disease)   . Hyperlipemia   . History of MRSA infection     after chain saw accident  . Arthritis     left hand    Past Surgical History  Procedure Laterality Date  . Colonoscopy  09/05/10    normal    Family History  Problem Relation Age of Onset  . Hypertension Mother   . Asthma Mother   . Arthritis Maternal Grandmother     rheumatoid  . Arthritis Paternal Grandmother     rheumatoid  . Arthritis Paternal Grandfather     rheumatoid  . Colon cancer Neg Hx   . Obesity Brother   . Hypertension Brother     History   Social History  . Marital Status: Married    Spouse Name: N/A    Number of Children: 3  . Years of Education: N/A   Occupational History  . 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 Smoking status: Former Games developermoker  . Smokeless tobacco: Never Used     Comment: quit 12/10  . Alcohol  Use: 14.4 oz/week    24 Cans of beer per week  . Drug Use: No  . Sexual Activity: Not on file   Other Topics Concern  . Not on file   Social History Narrative  . No narrative on file   Review of Systems  Constitutional: Negative for fatigue and unexpected weight change.       Wears seat belt  HENT: Negative for dental problem, hearing loss, tinnitus and trouble swallowing.        Regular with dentist  Eyes: Negative for visual disturbance.       No diplopia or unilateral vision loss New Rx for glasses  Respiratory: Negative for cough, chest tightness and shortness of breath.   Cardiovascular: Negative for chest pain, palpitations and leg swelling.  Gastrointestinal: Negative for nausea, vomiting, abdominal pain, constipation and blood in stool.       Stomach quiet on nexium  Endocrine: Negative for polydipsia and polyuria.  Genitourinary: Negative for urgency, frequency and difficulty urinating.       Mild ED--not ready for meds  Musculoskeletal: Positive for arthralgias. Negative for back pain and joint swelling.  Hands and some knee issues  Skin: Negative for rash.       Still with warts on fingers--he will try OTC  Allergic/Immunologic: Negative for environmental allergies and immunocompromised state.  Neurological: Negative for dizziness, syncope, weakness, light-headedness and headaches.       Some hand and feet tingling  Hematological: Negative for adenopathy. Does not bruise/bleed easily.  Psychiatric/Behavioral: Positive for sleep disturbance and dysphoric mood. The patient is nervous/anxious.        Rarely needs the alprazolam--has helped through stress with daughter dying in MVA in June       Objective:   Physical Exam  Constitutional: He is oriented to person, place, and time. He appears well-developed and well-nourished. No distress.  HENT:  Head: Normocephalic and atraumatic.  Right Ear: External ear normal.  Left Ear: External ear normal.    Mouth/Throat: Oropharynx is clear and moist.  Eyes: Conjunctivae and EOM are normal. Pupils are equal, round, and reactive to light.  Neck: Normal range of motion. Neck supple. No thyromegaly present.  Cardiovascular: Normal rate, regular rhythm, normal heart sounds and intact distal pulses.  Exam reveals no gallop.   No murmur heard. Pulmonary/Chest: Effort normal and breath sounds normal. No respiratory distress. He has no wheezes. He has no rales.  Abdominal: Soft. There is no tenderness.  Musculoskeletal: He exhibits no edema and no tenderness.  Lymphadenopathy:    He has no cervical adenopathy.  Neurological: He is alert and oriented to person, place, and time.  Skin: No rash noted. No erythema.  Psychiatric: He has a normal mood and affect. His behavior is normal.          Assessment & Plan:

## 2013-12-02 NOTE — Assessment & Plan Note (Signed)
Quiet on PPI 

## 2013-12-02 NOTE — Addendum Note (Signed)
Addended by: Sueanne MargaritaSMITH, Edana Aguado L on: 12/02/2013 12:28 PM   Modules accepted: Orders

## 2013-12-02 NOTE — Progress Notes (Signed)
Pre visit review using our clinic review tool, if applicable. No additional management support is needed unless otherwise documented below in the visit note. 

## 2013-12-02 NOTE — Assessment & Plan Note (Signed)
Healthy Discussed fitness Flu shot

## 2013-12-02 NOTE — Assessment & Plan Note (Signed)
Rarely needs the alprazolam

## 2013-12-02 NOTE — Patient Instructions (Signed)
DASH Eating Plan °DASH stands for "Dietary Approaches to Stop Hypertension." The DASH eating plan is a healthy eating plan that has been shown to reduce high blood pressure (hypertension). Additional health benefits may include reducing the risk of type 2 diabetes mellitus, heart disease, and stroke. The DASH eating plan may also help with weight loss. °WHAT DO I NEED TO KNOW ABOUT THE DASH EATING PLAN? °For the DASH eating plan, you will follow these general guidelines: °· Choose foods with a percent daily value for sodium of less than 5% (as listed on the food label). °· Use salt-free seasonings or herbs instead of table salt or sea salt. °· Check with your health care provider or pharmacist before using salt substitutes. °· Eat lower-sodium products, often labeled as "lower sodium" or "no salt added." °· Eat fresh foods. °· Eat more vegetables, fruits, and low-fat dairy products. °· Choose whole grains. Look for the word "whole" as the first word in the ingredient list. °· Choose fish and skinless chicken or turkey more often than red meat. Limit fish, poultry, and meat to 6 oz (170 g) each day. °· Limit sweets, desserts, sugars, and sugary drinks. °· Choose heart-healthy fats. °· Limit cheese to 1 oz (28 g) per day. °· Eat more home-cooked food and less restaurant, buffet, and fast food. °· Limit fried foods. °· Cook foods using methods other than frying. °· Limit canned vegetables. If you do use them, rinse them well to decrease the sodium. °· When eating at a restaurant, ask that your food be prepared with less salt, or no salt if possible. °WHAT FOODS CAN I EAT? °Seek help from a dietitian for individual calorie needs. °Grains °Whole grain or whole wheat bread. Brown rice. Whole grain or whole wheat pasta. Quinoa, bulgur, and whole grain cereals. Low-sodium cereals. Corn or whole wheat flour tortillas. Whole grain cornbread. Whole grain crackers. Low-sodium crackers. °Vegetables °Fresh or frozen vegetables  (raw, steamed, roasted, or grilled). Low-sodium or reduced-sodium tomato and vegetable juices. Low-sodium or reduced-sodium tomato sauce and paste. Low-sodium or reduced-sodium canned vegetables.  °Fruits °All fresh, canned (in natural juice), or frozen fruits. °Meat and Other Protein Products °Ground beef (85% or leaner), grass-fed beef, or beef trimmed of fat. Skinless chicken or turkey. Ground chicken or turkey. Pork trimmed of fat. All fish and seafood. Eggs. Dried beans, peas, or lentils. Unsalted nuts and seeds. Unsalted canned beans. °Dairy °Low-fat dairy products, such as skim or 1% milk, 2% or reduced-fat cheeses, low-fat ricotta or cottage cheese, or plain low-fat yogurt. Low-sodium or reduced-sodium cheeses. °Fats and Oils °Tub margarines without trans fats. Light or reduced-fat mayonnaise and salad dressings (reduced sodium). Avocado. Safflower, olive, or canola oils. Natural peanut or almond butter. °Other °Unsalted popcorn and pretzels. °The items listed above may not be a complete list of recommended foods or beverages. Contact your dietitian for more options. °WHAT FOODS ARE NOT RECOMMENDED? °Grains °White bread. White pasta. White rice. Refined cornbread. Bagels and croissants. Crackers that contain trans fat. °Vegetables °Creamed or fried vegetables. Vegetables in a cheese sauce. Regular canned vegetables. Regular canned tomato sauce and paste. Regular tomato and vegetable juices. °Fruits °Dried fruits. Canned fruit in light or heavy syrup. Fruit juice. °Meat and Other Protein Products °Fatty cuts of meat. Ribs, chicken wings, bacon, sausage, bologna, salami, chitterlings, fatback, hot dogs, bratwurst, and packaged luncheon meats. Salted nuts and seeds. Canned beans with salt. °Dairy °Whole or 2% milk, cream, half-and-half, and cream cheese. Whole-fat or sweetened yogurt. Full-fat   cheeses or blue cheese. Nondairy creamers and whipped toppings. Processed cheese, cheese spreads, or cheese  curds. °Condiments °Onion and garlic salt, seasoned salt, table salt, and sea salt. Canned and packaged gravies. Worcestershire sauce. Tartar sauce. Barbecue sauce. Teriyaki sauce. Soy sauce, including reduced sodium. Steak sauce. Fish sauce. Oyster sauce. Cocktail sauce. Horseradish. Ketchup and mustard. Meat flavorings and tenderizers. Bouillon cubes. Hot sauce. Tabasco sauce. Marinades. Taco seasonings. Relishes. °Fats and Oils °Butter, stick margarine, lard, shortening, ghee, and bacon fat. Coconut, palm kernel, or palm oils. Regular salad dressings. °Other °Pickles and olives. Salted popcorn and pretzels. °The items listed above may not be a complete list of foods and beverages to avoid. Contact your dietitian for more information. °WHERE CAN I FIND MORE INFORMATION? °National Heart, [employer], and [employer] Institute: www.nhlbi.nih.gov/health/health-topics/topics/dash/ °Document Released: 01/19/2011 Document Revised: 06/16/2013 Document Reviewed: 12/04/2012 °Exit[employer]® Patient Information ©2015 [employer]. This information is not intended to replace advice given to you by your health care provider. Make sure you discuss any questions you have with your health care provider. ° °

## 2013-12-02 NOTE — Assessment & Plan Note (Signed)
Discussed primary prevention He will start statin if LDL >190

## 2013-12-03 LAB — LDL CHOLESTEROL, DIRECT: Direct LDL: 168.3 mg/dL

## 2014-01-23 ENCOUNTER — Other Ambulatory Visit: Payer: Self-pay | Admitting: Internal Medicine

## 2014-03-04 ENCOUNTER — Other Ambulatory Visit: Payer: Self-pay | Admitting: Internal Medicine

## 2014-03-04 NOTE — Telephone Encounter (Signed)
11/15/13 

## 2014-03-04 NOTE — Telephone Encounter (Signed)
rx called into pharmacy

## 2014-03-04 NOTE — Telephone Encounter (Signed)
Approved: #[id number] x [id number] 

## 2014-12-11 ENCOUNTER — Encounter: Payer: Self-pay | Admitting: Internal Medicine

## 2014-12-11 ENCOUNTER — Ambulatory Visit (INDEPENDENT_AMBULATORY_CARE_PROVIDER_SITE_OTHER): Payer: 59 | Admitting: Internal Medicine

## 2014-12-11 VITALS — BP 142/90 | HR 73 | Temp 97.6°F | Ht 69.5 in | Wt 224.2 lb

## 2014-12-11 DIAGNOSIS — E785 Hyperlipidemia, unspecified: Secondary | ICD-10-CM | POA: Diagnosis not present

## 2014-12-11 DIAGNOSIS — Z23 Encounter for immunization: Secondary | ICD-10-CM

## 2014-12-11 DIAGNOSIS — K219 Gastro-esophageal reflux disease without esophagitis: Secondary | ICD-10-CM

## 2014-12-11 DIAGNOSIS — R030 Elevated blood-pressure reading, without diagnosis of hypertension: Secondary | ICD-10-CM | POA: Diagnosis not present

## 2014-12-11 DIAGNOSIS — Z0000 Encounter for general adult medical examination without abnormal findings: Secondary | ICD-10-CM | POA: Diagnosis not present

## 2014-12-11 DIAGNOSIS — Z125 Encounter for screening for malignant neoplasm of prostate: Secondary | ICD-10-CM

## 2014-12-11 DIAGNOSIS — I10 Essential (primary) hypertension: Secondary | ICD-10-CM | POA: Insufficient documentation

## 2014-12-11 NOTE — Progress Notes (Signed)
Subjective:    Patient ID: Gabriel [name], male    DOB: 1960-03-04, 54 y.o.   MRN: [id number]  HPI Here for physical  Doing well Has checked BP --has been in 140's at home Known HTN in family-- no CVA or MIs though He notices a change in ability to do tasks at work---like kneeling to paint baseboard  Having pain in right heel Relates to new golf shoes and playing 27 holes after that Clearly plantar fasciitis--discussed this  Still takes alprazolam to help sleep 2-3 a month No mood problems  Takes nexium fairly regularly No heartburn or swallowing problems  Current Outpatient Prescriptions on File Prior to Visit  Medication Sig Dispense Refill  . ALPRAZolam (XANAX) 0.5 MG tablet TAKE 1 TABLET BY MOUTH 2 (TWO)TIMES A DAY AS NEEDED FOR NERVES. 60 tablet 0  . NEXIUM 40 MG capsule TAKE ONE (1) CAPSULE BY MOUTH EACH DAY 90 capsule 3  . vitamin B-12 (CYANOCOBALAMIN) 1000 MCG tablet Take 1,000 mcg by mouth daily.     No current facility-administered medications on file prior to visit.    Allergies  Allergen Reactions  . Codeine Sulfate     REACTION: Itching    Past Medical History  Diagnosis Date  . GERD (gastroesophageal reflux disease)   . Hyperlipemia   . History of MRSA infection     after chain saw accident  . Arthritis     left hand    Past Surgical History  Procedure Laterality Date  . Colonoscopy  09/05/10    normal    Family History  Problem Relation Age of Onset  . Hypertension Mother   . Asthma Mother   . Arthritis Maternal Grandmother     rheumatoid  . Arthritis Paternal Grandmother     rheumatoid  . Arthritis Paternal Grandfather     rheumatoid  . Colon cancer Neg Hx   . Obesity Brother   . Hypertension Brother     Social History   Social History  . Marital Status: Married    Spouse Name: N/A  . Number of Children: 3  . Years of Education: N/A   Occupational History  . 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 Smoking status: Former Games developer  . Smokeless tobacco: Never Used     Comment: quit 12/10  . Alcohol Use: 14.4 oz/week    24 Cans of beer per week     Comment: occ  . Drug Use: No  . Sexual Activity: Not on file   Other Topics Concern  . Not on file   Social History Narrative   Review of Systems  Constitutional: Negative for fatigue and unexpected weight change.       Wears seat belt  HENT: Positive for dental problem. Negative for hearing loss, tinnitus and trouble swallowing.        Getting implants done   Eyes: Negative for visual disturbance.       No diplopia or unilateral vision loss  Respiratory: Negative for cough, chest tightness and shortness of breath.   Cardiovascular: Negative for chest pain, palpitations and leg swelling.  Gastrointestinal: Negative for nausea, vomiting, abdominal pain, constipation and blood in stool.  Endocrine: Negative for polydipsia and polyuria.  Genitourinary: Negative for urgency and difficulty urinating.       Mild ED-- not an issue  Musculoskeletal: Positive for arthralgias. Negative for back pain.  Heel, left hand still with pain  Skin: Negative for rash.  Allergic/Immunologic: Negative for environmental allergies and immunocompromised state.  Neurological: Negative for dizziness, syncope, weakness, light-headedness and headaches.  Hematological: Negative for adenopathy. Does not bruise/bleed easily.  Psychiatric/Behavioral: Positive for sleep disturbance. Negative for dysphoric mood. The patient is not nervous/anxious.        Objective:   Physical Exam  Constitutional: He is oriented to person, place, and time. He appears well-developed and well-nourished. No distress.  HENT:  Head: Normocephalic and atraumatic.  Right Ear: External ear normal.  Left Ear: External ear normal.  Mouth/Throat: Oropharynx is clear and moist.  Eyes: Conjunctivae are normal. Pupils are equal, round, and reactive to light.  Neck:  Normal range of motion. Neck supple. No thyromegaly present.  Cardiovascular: Normal rate, regular rhythm, normal heart sounds and intact distal pulses.  Exam reveals no gallop.   No murmur heard. Pulmonary/Chest: Effort normal and breath sounds normal. No respiratory distress. He has no wheezes. He has no rales.  Abdominal: Soft. There is no tenderness.  Musculoskeletal: He exhibits no edema or tenderness.  Lymphadenopathy:    He has no cervical adenopathy.  Neurological: He is alert and oriented to person, place, and time.  Skin: No rash noted. No erythema.  Psychiatric: He has a normal mood and affect. His behavior is normal.          Assessment & Plan:

## 2014-12-11 NOTE — Progress Notes (Signed)
Pre visit review using our clinic review tool, if applicable. No additional management support is needed unless otherwise documented below in the visit note. 

## 2014-12-11 NOTE — Assessment & Plan Note (Signed)
Generally healthy Flu vaccine today PSA after discussion Colon due 2022 Tdap due next year

## 2014-12-11 NOTE — Addendum Note (Signed)
Addended by: Shon MilletWATLINGTON, Janeva Peaster M on: 12/11/2014 09:27 AM   Modules accepted: Orders

## 2014-12-11 NOTE — Assessment & Plan Note (Signed)
Statin if LDL over 190

## 2014-12-11 NOTE — Assessment & Plan Note (Signed)
BP Readings from Last 3 Encounters:  12/11/14 142/90  12/02/13 122/80  11/15/12 112/70   Repeat 146/90 on right If stays up, will start meds in the next few months (he will monitor)

## 2014-12-11 NOTE — Assessment & Plan Note (Signed)
Okay on the med 

## 2014-12-11 NOTE — Patient Instructions (Addendum)
Check your blood pressure once or twice a month. Let me know if it stays over 140/90.  DASH Eating Plan DASH stands for "Dietary Approaches to Stop Hypertension." The DASH eating plan is a healthy eating plan that has been shown to reduce high blood pressure (hypertension). Additional health benefits may include reducing the risk of type 2 diabetes mellitus, heart disease, and stroke. The DASH eating plan may also help with weight loss. WHAT DO I NEED TO KNOW ABOUT THE DASH EATING PLAN? For the DASH eating plan, you will follow these general guidelines:  Choose foods with a percent daily value for sodium of less than 5% (as listed on the food label).  Use salt-free seasonings or herbs instead of table salt or sea salt.  Check with your health care provider or pharmacist before using salt substitutes.  Eat lower-sodium products, often labeled as "lower sodium" or "no salt added."  Eat fresh foods.  Eat more vegetables, fruits, and low-fat dairy products.  Choose whole grains. Look for the word "whole" as the first word in the ingredient list.  Choose fish and skinless chicken or [place] more often than red meat. Limit fish, poultry, and meat to 6 oz (170 g) each day.  Limit sweets, desserts, sugars, and sugary drinks.  Choose heart-healthy fats.  Limit cheese to 1 oz (28 g) per day.  Eat more home-cooked food and less restaurant, buffet, and fast food.  Limit fried foods.  Cook foods using methods other than frying.  Limit canned vegetables. If you do use them, rinse them well to decrease the sodium.  When eating at a restaurant, ask that your food be prepared with less salt, or no salt if possible. WHAT FOODS CAN I EAT? Seek help from a dietitian for individual calorie needs. Grains Whole grain or whole wheat bread. Brown rice. Whole grain or whole wheat pasta. Quinoa, bulgur, and whole grain cereals. Low-sodium cereals. Corn or whole wheat flour tortillas. Whole grain  cornbread. Whole grain crackers. Low-sodium crackers. Vegetables Fresh or frozen vegetables (raw, steamed, roasted, or grilled). Low-sodium or reduced-sodium tomato and vegetable juices. Low-sodium or reduced-sodium tomato sauce and paste. Low-sodium or reduced-sodium canned vegetables.  Fruits All fresh, canned (in natural juice), or frozen fruits. Meat and Other Protein Products Ground beef (85% or leaner), grass-fed beef, or beef trimmed of fat. Skinless chicken or [place]. Ground chicken or [place]. Pork trimmed of fat. All fish and seafood. Eggs. Dried beans, peas, or lentils. Unsalted nuts and seeds. Unsalted canned beans. Dairy Low-fat dairy products, such as skim or 1% milk, 2% or reduced-fat cheeses, low-fat ricotta or cottage cheese, or plain low-fat yogurt. Low-sodium or reduced-sodium cheeses. Fats and Oils Tub margarines without trans fats. Light or reduced-fat mayonnaise and salad dressings (reduced sodium). Avocado. Safflower, olive, or canola oils. Natural peanut or almond butter. Other Unsalted popcorn and pretzels. The items listed above may not be a complete list of recommended foods or beverages. Contact your dietitian for more options. WHAT FOODS ARE NOT RECOMMENDED? Grains White bread. White pasta. White rice. Refined cornbread. Bagels and croissants. Crackers that contain trans fat. Vegetables Creamed or fried vegetables. Vegetables in a cheese sauce. Regular canned vegetables. Regular canned tomato sauce and paste. Regular tomato and vegetable juices. Fruits Dried fruits. Canned fruit in light or heavy syrup. Fruit juice. Meat and Other Protein Products Fatty cuts of meat. Ribs, chicken wings, bacon, sausage, bologna, salami, chitterlings, fatback, hot dogs, bratwurst, and packaged luncheon meats. Salted nuts and seeds. Canned  beans with salt. Dairy Whole or 2% milk, cream, half-and-half, and cream cheese. Whole-fat or sweetened yogurt. Full-fat cheeses or blue cheese.  Nondairy creamers and whipped toppings. Processed cheese, cheese spreads, or cheese curds. Condiments Onion and garlic salt, seasoned salt, table salt, and sea salt. Canned and packaged gravies. Worcestershire sauce. Tartar sauce. Barbecue sauce. Teriyaki sauce. Soy sauce, including reduced sodium. Steak sauce. Fish sauce. Oyster sauce. Cocktail sauce. Horseradish. Ketchup and mustard. Meat flavorings and tenderizers. Bouillon cubes. Hot sauce. Tabasco sauce. Marinades. Taco seasonings. Relishes. Fats and Oils Butter, stick margarine, lard, shortening, ghee, and bacon fat. Coconut, palm kernel, or palm oils. Regular salad dressings. Other Pickles and olives. Salted popcorn and pretzels. The items listed above may not be a complete list of foods and beverages to avoid. Contact your dietitian for more information. WHERE CAN I FIND MORE INFORMATION? National Heart, Lung, and Blood Institute: CableProm[employer].itwww.nhlbi.nih.gov/health/health-topics/topics/dash/   This information is not intended to replace advice given to you by your health care provider. Make sure you discuss any questions you have with your health care provider.   Document Released: 01/19/2011 Document Revised: 02/20/2014 Document Reviewed: 12/04/2012 Elsevier Interactive Patient Education 2016 [employer] Exercising to [employer] MinorLose Weight Exercising can help you to lose weight. In order to lose weight through exercise, you need to do vigorous-intensity exercise. You can tell that you are exercising with vigorous intensity if you are breathing very hard and fast and cannot hold a conversation while exercising. Moderate-intensity exercise helps to maintain your current weight. You can tell that you are exercising at a moderate level if you have a higher heart rate and faster breathing, but you are still able to hold a conversation. HOW OFTEN SHOULD I EXERCISE? Choose an activity that you enjoy and set realistic goals. Your health care provider can help you  to make an activity plan that works for you. Exercise regularly as directed by your health care provider. This may include:  Doing resistance training twice each week, such as:  Push-ups.  Sit-ups.  Lifting weights.  Using resistance bands.  Doing a given intensity of exercise for a given amount of time. Choose from these options:  150 minutes of moderate-intensity exercise every week.  75 minutes of vigorous-intensity exercise every week.  A mix of moderate-intensity and vigorous-intensity exercise every week. Children, pregnant women, people who are out of shape, people who are overweight, and older adults may need to consult a health care provider for individual recommendations. If you have any sort of medical condition, be sure to consult your health care provider before starting a new exercise program. WHAT ARE SOME ACTIVITIES THAT CAN HELP ME TO LOSE WEIGHT?   Walking at a rate of at least 4.5 miles an hour.  Jogging or running at a rate of 5 miles per hour.  Biking at a rate of at least 10 miles per hour.  Lap swimming.  Roller-skating or in-line skating.  Cross-country skiing.  Vigorous competitive sports, such as football, basketball, and soccer.  Jumping rope.  Aerobic dancing. HOW CAN I BE MORE ACTIVE IN MY DAY-TO-DAY ACTIVITIES?  Use the stairs instead of the elevator.  Take a walk during your lunch break.  If you drive, park your car farther away from work or school.  If you take public transportation, get off one stop early and walk the rest of the way.  Make all of your phone calls while standing up and walking around.  Get up, stretch, and walk around every 30  minutes throughout the day. WHAT GUIDELINES SHOULD I FOLLOW WHILE EXERCISING?  Do not exercise so much that you hurt yourself, feel dizzy, or get very short of breath.  Consult your health care provider prior to starting a new exercise program.  Wear comfortable clothes and shoes with  good support.  Drink plenty of water while you exercise to prevent dehydration or heat stroke. Body water is lost during exercise and must be replaced.  Work out until you breathe faster and your heart beats faster.   This information is not intended to replace advice given to you by your health care provider. Make sure you discuss any questions you have with your health care provider.   Document Released: 03/04/2010 Document Revised: 02/20/2014 Document Reviewed: 07/03/2013 Elsevier Interactive Patient Education 2016 Elsevier Inc.  Plantar Fasciitis Plantar fasciitis is a painful foot condition that affects the heel. It occurs when the band of tissue that connects the toes to the heel bone (plantar fascia) becomes irritated. This can happen after exercising too much or doing other repetitive activities (overuse injury). The pain from plantar fasciitis can range from mild irritation to severe pain that makes it difficult for you to walk or move. The pain is usually worse in the morning or after you have been sitting or lying down for a while. CAUSES This condition may be caused by:  Standing for long periods of time.  Wearing shoes that do not fit.  Doing high-impact activities, including running, aerobics, and ballet.  Being overweight.  Having an abnormal way of walking (gait).  Having tight calf muscles.  Having high arches in your feet.  Starting a new athletic activity. SYMPTOMS The main symptom of this condition is heel pain. Other symptoms include:  Pain that gets worse after activity or exercise.  Pain that is worse in the morning or after resting.  Pain that goes away after you walk for a few minutes. DIAGNOSIS This condition may be diagnosed based on your signs and symptoms. Your health care provider will also do a physical exam to check for:  A tender area on the bottom of your foot.  A high arch in your foot.  Pain when you move your foot.  Difficulty  moving your foot. You may also need to have imaging studies to confirm the diagnosis. These can include:  X-rays.  Ultrasound.  MRI. TREATMENT  Treatment for plantar fasciitis depends on the severity of the condition. Your treatment may include:  Rest, ice, and over-the-counter pain medicines to manage your pain.  Exercises to stretch your calves and your plantar fascia.  A splint that holds your foot in a stretched, upward position while you sleep (night splint).  Physical therapy to relieve symptoms and prevent problems in the future.  Cortisone injections to relieve severe pain.  Extracorporeal shock wave therapy (ESWT) to stimulate damaged plantar fascia with electrical impulses. It is often used as a last resort before surgery.  Surgery, if other treatments have not worked after 12 months. HOME CARE INSTRUCTIONS  Take medicines only as directed by your health care provider.  Avoid activities that cause pain.  Roll the bottom of your foot over a bag of ice or a bottle of cold water. Do this for 20 minutes, 3-4 times a day.  Perform simple stretches as directed by your health care provider.  Try wearing athletic shoes with air-sole or gel-sole cushions or soft shoe inserts.  Wear a night splint while sleeping, if directed by your health  care provider.  Keep all follow-up appointments with your health care provider. PREVENTION   Do not perform exercises or activities that cause heel pain.  Consider finding low-impact activities if you continue to have problems.  Lose weight if you need to. The best way to prevent plantar fasciitis is to avoid the activities that aggravate your plantar fascia. SEEK MEDICAL CARE IF:  Your symptoms do not go away after treatment with home care measures.  Your pain gets worse.  Your pain affects your ability to move or do your daily activities.   This information is not intended to replace advice given to you by your health care  provider. Make sure you discuss any questions you have with your health care provider.   Document Released: 10/25/2000 Document Revised: 10/21/2014 Document Reviewed: 12/10/2013 Elsevier Interactive Patient Education [employer]

## 2014-12-24 ENCOUNTER — Other Ambulatory Visit (INDEPENDENT_AMBULATORY_CARE_PROVIDER_SITE_OTHER): Payer: 59

## 2014-12-24 DIAGNOSIS — E785 Hyperlipidemia, unspecified: Secondary | ICD-10-CM | POA: Diagnosis not present

## 2014-12-24 DIAGNOSIS — Z125 Encounter for screening for malignant neoplasm of prostate: Secondary | ICD-10-CM | POA: Diagnosis not present

## 2014-12-24 LAB — COMPREHENSIVE METABOLIC PANEL
ALT: 19 U/L (ref 0–53)
AST: 16 U/L (ref 0–37)
Albumin: 4.4 g/dL (ref 3.5–5.2)
Alkaline Phosphatase: 69 U/L (ref 39–117)
BUN: 12 mg/dL (ref 6–23)
CO2: 29 mEq/L (ref 19–32)
Calcium: 9.6 mg/dL (ref 8.4–10.5)
Chloride: 103 mEq/L (ref 96–112)
Creatinine, Ser: 1.15 mg/dL (ref 0.40–1.50)
GFR: 70.23 mL/min (ref >60.00)
Glucose, Bld: 107 mg/dL — ABNORMAL HIGH (ref 70–99)
Potassium: 4.5 mEq/L (ref 3.5–5.1)
Sodium: 139 mEq/L (ref 135–145)
Total Bilirubin: 0.5 mg/dL (ref 0.2–1.2)
Total Protein: 7 g/dL (ref 6.0–8.3)

## 2014-12-24 LAB — LIPID PANEL
Cholesterol: 279 mg/dL — ABNORMAL HIGH (ref 0–200)
HDL: 35.3 mg/dL — ABNORMAL LOW (ref >39.00)
NonHDL: 244.14
Total CHOL/HDL Ratio: 8
Triglycerides: 340 mg/dL — ABNORMAL HIGH (ref 0.0–149.0)
VLDL: 68 mg/dL — ABNORMAL HIGH (ref 0.0–40.0)

## 2014-12-24 LAB — CBC WITH DIFFERENTIAL/PLATELET
Basophils Absolute: 0 10*3/uL (ref 0.0–0.1)
Basophils Relative: 0.5 % (ref 0.0–3.0)
Eosinophils Absolute: 0.1 10*3/uL (ref 0.0–0.7)
Eosinophils Relative: 2.3 % (ref 0.0–5.0)
HCT: 44 % (ref 39.0–52.0)
Hemoglobin: 14.7 g/dL (ref 13.0–17.0)
Lymphocytes Relative: 32.9 % (ref 12.0–46.0)
Lymphs Abs: 1.8 10*3/uL (ref 0.7–4.0)
MCHC: 33.3 g/dL (ref 30.0–36.0)
MCV: 95.6 fl (ref 78.0–100.0)
Monocytes Absolute: 0.4 10*3/uL (ref 0.1–1.0)
Monocytes Relative: 7.6 % (ref 3.0–12.0)
Neutro Abs: 3 10*3/uL (ref 1.4–7.7)
Neutrophils Relative %: 56.7 % (ref 43.0–77.0)
Platelets: 224 10*3/uL (ref 150.0–400.0)
RBC: 4.61 Mil/uL (ref 4.22–5.81)
RDW: 12.9 % (ref 11.5–15.5)
WBC: 5.3 10*3/uL (ref 4.0–10.5)

## 2014-12-24 LAB — PSA: PSA: 0.34 ng/mL (ref 0.10–4.00)

## 2014-12-24 LAB — LDL CHOLESTEROL, DIRECT: Direct LDL: 157 mg/dL

## 2014-12-25 ENCOUNTER — Encounter: Payer: Self-pay | Admitting: *Deleted

## 2015-01-01 ENCOUNTER — Telehealth: Payer: Self-pay | Admitting: Internal Medicine

## 2015-01-01 NOTE — Telephone Encounter (Signed)
Patient Name: Gabriel [name] DOB: 1960/08/31 Initial Comment Caller states Bp 144/94; has been swimming and it went to 161/97 now it is 148/94 Nurse Assessment Nurse: Yetta BarreJones, RN, Miranda Date/Time (Eastern Time): 01/01/2015 2:43:07 PM Confirm and document reason for call. If symptomatic, describe symptoms. ---Caller states at his physical 1 month ago, his BP was elevated and the doctor told him some lifestyle changes to do. BP is 148/94 today and he has felt dizzy the last couple of days. Has the patient traveled out of the country within the last 30 days? ---Not Applicable Does the patient have any new or worsening symptoms? ---Yes Will a triage be completed? ---Yes Related visit to physician within the last 2 weeks? ---No Does the PT have any chronic conditions? (i.e. diabetes, asthma, etc.) ---No Is this a behavioral health call? ---No Guidelines Guideline Title Affirmed Question Affirmed Notes Dizziness - Lightheadedness [1] MODERATE dizziness (e.g., interferes with normal activities) AND [2] has NOT been evaluated by physician for this (Exception: dizziness caused by heat exposure, sudden standing, or poor fluid intake) Final Disposition User See Physician within 24 Hours Yetta BarreJones, RN, Miranda Comments Offered appt at the [facility] clinic which is at the [place] location this week and caller declined. He will go to [facility]. Referrals GO TO FACILITY UNDECIDED Disagree/Comply: Comply

## 2015-01-02 NOTE — Telephone Encounter (Signed)
I am not really sure he needed to be seen--find out if he went. I think we should go ahead and start medication. If he is willing, send losartan/HCTZ 50/12.5---- 1 daily (1 year) Set up appt in about 6 weeks

## 2015-01-04 NOTE — Telephone Encounter (Signed)
Pt scheduled appt for tomorrow to discuss blood pressure

## 2015-01-05 ENCOUNTER — Ambulatory Visit (INDEPENDENT_AMBULATORY_CARE_PROVIDER_SITE_OTHER): Payer: 59 | Admitting: Internal Medicine

## 2015-01-05 ENCOUNTER — Encounter: Payer: Self-pay | Admitting: Internal Medicine

## 2015-01-05 VITALS — BP 128/88 | HR 81 | Temp 98.1°F | Wt 219.0 lb

## 2015-01-05 DIAGNOSIS — R030 Elevated blood-pressure reading, without diagnosis of hypertension: Secondary | ICD-10-CM | POA: Diagnosis not present

## 2015-01-05 MED ORDER — LOSARTAN POTASSIUM 50 MG PO TABS: 50.0000 mg | ORAL_TABLET | Freq: Every day | ORAL | Status: DC

## 2015-01-05 NOTE — Assessment & Plan Note (Signed)
BP Readings from Last 3 Encounters:  01/05/15 128/88  12/11/14 142/90  12/02/13 122/80   BP high when he checks and he doesn't feel well Face is red now and he feels bad--but BP better No diarrhea/flushing---just headache and dizziness  Unclear situation Will try low dose losartan and follow up

## 2015-01-05 NOTE — Progress Notes (Signed)
   Subjective:    Patient ID: Gabriel [name], male    DOB: Oct 29, 1960, 54 y.o.   MRN: [id number]  HPI Checked [facility] BP at both [facility] 166/103 once, sat for 15 minutes--- down to 160/97  [facility]'s [facility] [place]  Feels swimmy headed Some headache No chest pain No SOB  Has taken 600mg  ibuprofen twice in the past few days Last dose yesterday  Some stress with son moving back in with him  Current Outpatient Prescriptions on File Prior to Visit  Medication Sig Dispense Refill  . ALPRAZolam (XANAX) 0.5 MG tablet TAKE 1 TABLET BY MOUTH 2 (TWO)TIMES A DAY AS NEEDED FOR NERVES. 60 tablet 0  . NEXIUM 40 MG capsule TAKE ONE (1) CAPSULE BY MOUTH EACH DAY 90 capsule 3  . vitamin B-12 (CYANOCOBALAMIN) 1000 MCG tablet Take 1,000 mcg by mouth daily.     No current facility-administered medications on file prior to visit.    Allergies  Allergen Reactions  . Codeine Sulfate     REACTION: Itching    Past Medical History  Diagnosis Date  . GERD (gastroesophageal reflux disease)   . Hyperlipemia   . History of MRSA infection     after chain saw accident  . Arthritis     left hand    Past Surgical History  Procedure Laterality Date  . Colonoscopy  09/05/10    normal    Family History  Problem Relation Age of Onset  . Hypertension Mother   . Asthma Mother   . Arthritis Maternal Grandmother     rheumatoid  . Arthritis Paternal Grandmother     rheumatoid  . Arthritis Paternal Grandfather     rheumatoid  . Colon cancer Neg Hx   . Obesity Brother   . Hypertension Brother     Social History   Social History  . Marital Status: Married    Spouse Name: N/A  . Number of Children: 3  . Years of Education: N/A   Occupational History  . 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 Smoking status: Former Games developermoker  . Smokeless tobacco: Never Used     Comment: quit 12/10  . Alcohol Use: 14.4 oz/week    24 Cans of beer  per week     Comment: occ  . Drug Use: No  . Sexual Activity: Not on file   Other Topics Concern  . Not on file   Social History Narrative   Review of Systems Sleeping okay Appetite is good---eating healthier Weight down a few pounds    Objective:   Physical Exam  Constitutional: He appears well-developed and well-nourished. No distress.  Neck: Normal range of motion. Neck supple. No thyromegaly present.  Cardiovascular: Normal rate, regular rhythm and normal heart sounds.  Exam reveals no gallop.   No murmur heard. Pulmonary/Chest: Effort normal and breath sounds normal. No respiratory distress. He has no wheezes. He has no rales.  Musculoskeletal: He exhibits no edema.  Lymphadenopathy:    He has no cervical adenopathy.          Assessment & Plan:

## 2015-01-05 NOTE — Progress Notes (Signed)
Pre visit review using our clinic review tool, if applicable. No additional management support is needed unless otherwise documented below in the visit note. 

## 2015-01-05 NOTE — Patient Instructions (Signed)
Start the losartan daily. Let me know if you continue to feel bad--- or if you have worsened dizziness, etc when you start the medication.

## 2015-02-22 ENCOUNTER — Other Ambulatory Visit: Payer: Self-pay | Admitting: Internal Medicine

## 2015-03-09 ENCOUNTER — Ambulatory Visit: Payer: 59 | Admitting: Internal Medicine

## 2015-05-29 ENCOUNTER — Other Ambulatory Visit: Payer: Self-pay | Admitting: Internal Medicine

## 2015-05-31 NOTE — Telephone Encounter (Signed)
Approved: #[id number] x [id number] 

## 2015-05-31 NOTE — Telephone Encounter (Signed)
Last filled 03-04-14 #60 Last OV 01-05-15 Next OV 12-15-15

## 2015-06-01 NOTE — Telephone Encounter (Signed)
Left refill on voice mail at pharmacy  

## 2015-12-15 ENCOUNTER — Encounter: Payer: Self-pay | Admitting: Primary Care

## 2015-12-15 ENCOUNTER — Encounter: Payer: 59 | Admitting: Internal Medicine

## 2015-12-15 ENCOUNTER — Ambulatory Visit (INDEPENDENT_AMBULATORY_CARE_PROVIDER_SITE_OTHER): Payer: 59 | Admitting: Primary Care

## 2015-12-15 VITALS — BP 142/86 | HR 68 | Temp 97.7°F | Ht 69.5 in | Wt 223.0 lb

## 2015-12-15 DIAGNOSIS — M79642 Pain in left hand: Secondary | ICD-10-CM | POA: Diagnosis not present

## 2015-12-15 NOTE — Progress Notes (Signed)
Subjective:    Patient ID: Gabriel MaserShannon B [name], male    DOB: [birth year], 55 y.o.   MRN: [id number]  HPI  Gabriel [name] is a 55 year old male with a history of osteoarthritis who presents today with a chief complaint of hand pain. His pain is located to his left dorsal hand (distal to 2nd digit) that has been present since yesterday. He was chopping firewood yesterday and felt a "pop". He chops firewood often and has had no problem in the past. His pain is only present with extension and flexion and will radiate to his left forearm. He has no pain at rest. He denies numbness/tinling, weakness. Prior history of arthritis in his hand and has had injections. He's not taken anything OTC for his symptoms.   Review of Systems  Musculoskeletal: Positive for arthralgias.       Left hand and forearm pain  Skin: Negative for color change and wound.  Neurological: Negative for weakness and numbness.       Past Medical History:  Diagnosis Date  . Arthritis    left hand  . GERD (gastroesophageal reflux disease)   . History of MRSA infection    after chain saw accident  . Hyperlipemia      Social History   Social History  . Marital status: Married    Spouse name: N/A  . Number of children: 3  . Years of education: N/A   Occupational History  . 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 Smoking status: Former Games developermoker  . Smokeless tobacco: Never Used     Comment: quit 12/10  . Alcohol use 14.4 oz/week    24 Cans of beer per week     Comment: occ  . Drug use: No  . Sexual activity: Not on file   Other Topics Concern  . Not on file   Social History Narrative  . No narrative on file    Past Surgical History:  Procedure Laterality Date  . COLONOSCOPY  09/05/10   normal    Family History  Problem Relation Age of Onset  . Hypertension Mother   . Asthma Mother   . Arthritis Maternal Grandmother     rheumatoid  . Arthritis Paternal Grandmother     rheumatoid  . Arthritis Paternal Grandfather     rheumatoid  . Colon cancer Neg Hx   . Obesity Brother   . Hypertension Brother     Allergies  Allergen Reactions  . Codeine Sulfate     REACTION: Itching    Current Outpatient Prescriptions on File Prior to Visit  Medication Sig Dispense Refill  . ALPRAZolam (XANAX) 0.5 MG tablet TAKE 1 TABLET BY MOUTH TWICE A DAY AS NEEDED FOR NERVES AS DIRECTED. 60 tablet 0  . losartan (COZAAR) 50 MG tablet Take 1 tablet (50 mg total) by mouth daily. 30 tablet 3  . NEXIUM 40 MG capsule TAKE ONE (1) CAPSULE BY MOUTH EACH DAY 90 capsule 3  . vitamin B-12 (CYANOCOBALAMIN) 1000 MCG tablet Take 1,000 mcg by mouth daily.     No current facility-administered medications on file prior to visit.     BP (!) 142/86   Pulse 68   Temp 97.7 F (36.5 C) (Oral)   Ht 5' 9.5" (1.765 m)   Wt 223 lb (101.2 kg)   SpO2 98%   BMI 32.46 kg/m    Objective:   Physical Exam  Constitutional: He appears well-nourished.  Cardiovascular: Normal rate and regular rhythm.   Pulses:      Radial pulses are 2+ on the right side, and 2+ on the left side.  Pulmonary/Chest: Effort normal and breath sounds normal.  Musculoskeletal:       Left wrist: He exhibits normal range of motion, no tenderness, no bony tenderness and no crepitus.       Left hand: He exhibits normal range of motion, no tenderness and no bony tenderness. Normal strength noted.  Skin: Skin is warm and dry.          Assessment & Plan:  Hand Pain:  Located to left hand since yesterday after chopping firewood. He's not taken anything OTC. Exam today without erythema, swelling, reduction in ROM. Pain present with fist and rotation. No instability, deformity, or crepitus noted. Suspect tendonitis at this point given location of pain with location of radiation up left forearm.  Will have him trial Ibuprofen 600 mg TID PRN. Discussed to wear a brace for support. No imaging required today given  examination and presentation. Return precautions provided. If pain [ersists, may consider imaging at that point.  Morrie Sheldonlark,Vora Clover Kendal, NP

## 2015-12-15 NOTE — Patient Instructions (Signed)
Try taking Ibuprofen for pain and inflammation to the tendons. Take 600 mg three times daily as needed.  Purchase a brace for your wrist to provide support. Wear this for the next 1-2 weeks.  Please notify me if no improvement in 2 weeks or if you develop numbness/tinlging, weakness, increased pain.  It was a pleasure meeting you!

## 2015-12-15 NOTE — Progress Notes (Signed)
Pre visit review using our clinic review tool, if applicable. No additional management support is needed unless otherwise documented below in the visit note. 

## 2016-01-13 ENCOUNTER — Ambulatory Visit (INDEPENDENT_AMBULATORY_CARE_PROVIDER_SITE_OTHER): Payer: 59 | Admitting: Internal Medicine

## 2016-01-13 ENCOUNTER — Encounter: Payer: Self-pay | Admitting: Internal Medicine

## 2016-01-13 VITALS — BP 122/90 | HR 78 | Temp 97.8°F | Ht 69.5 in | Wt 221.0 lb

## 2016-01-13 DIAGNOSIS — G479 Sleep disorder, unspecified: Secondary | ICD-10-CM | POA: Diagnosis not present

## 2016-01-13 DIAGNOSIS — Z23 Encounter for immunization: Secondary | ICD-10-CM

## 2016-01-13 DIAGNOSIS — K219 Gastro-esophageal reflux disease without esophagitis: Secondary | ICD-10-CM

## 2016-01-13 DIAGNOSIS — Z0000 Encounter for general adult medical examination without abnormal findings: Secondary | ICD-10-CM

## 2016-01-13 DIAGNOSIS — E785 Hyperlipidemia, unspecified: Secondary | ICD-10-CM

## 2016-01-13 NOTE — Patient Instructions (Signed)
DASH Eating Plan DASH stands for "Dietary Approaches to Stop Hypertension." The DASH eating plan is a healthy eating plan that has been shown to reduce high blood pressure (hypertension). Additional health benefits may include reducing the risk of type 2 diabetes mellitus, heart disease, and stroke. The DASH eating plan may also help with weight loss. What do I need to know about the DASH eating plan? For the DASH eating plan, you will follow these general guidelines:  Choose foods with less than 150 milligrams of sodium per serving (as listed on the food label).  Use salt-free seasonings or herbs instead of table salt or sea salt.  Check with your health care provider or pharmacist before using salt substitutes.  Eat lower-sodium products. These are often labeled as "low-sodium" or "no salt added."  Eat fresh foods. Avoid eating a lot of canned foods.  Eat more vegetables, fruits, and low-fat dairy products.  Choose whole grains. Look for the word "whole" as the first word in the ingredient list.  Choose fish and skinless chicken or turkey more often than red meat. Limit fish, poultry, and meat to 6 oz (170 g) each day.  Limit sweets, desserts, sugars, and sugary drinks.  Choose heart-healthy fats.  Eat more home-cooked food and less restaurant, buffet, and fast food.  Limit fried foods.  Do not fry foods. Cook foods using methods such as baking, boiling, grilling, and broiling instead.  When eating at a restaurant, ask that your food be prepared with less salt, or no salt if possible. What foods can I eat? Seek help from a dietitian for individual calorie needs. Grains  Whole grain or whole wheat bread. Brown rice. Whole grain or whole wheat pasta. Quinoa, bulgur, and whole grain cereals. Low-sodium cereals. Corn or whole wheat flour tortillas. Whole grain cornbread. Whole grain crackers. Low-sodium crackers. Vegetables  Fresh or frozen vegetables (raw, steamed, roasted, or  grilled). Low-sodium or reduced-sodium tomato and vegetable juices. Low-sodium or reduced-sodium tomato sauce and paste. Low-sodium or reduced-sodium canned vegetables. Fruits  All fresh, canned (in natural juice), or frozen fruits. Meat and Other Protein Products  Ground beef (85% or leaner), grass-fed beef, or beef trimmed of fat. Skinless chicken or turkey. Ground chicken or turkey. Pork trimmed of fat. All fish and seafood. Eggs. Dried beans, peas, or lentils. Unsalted nuts and seeds. Unsalted canned beans. Dairy  Low-fat dairy products, such as skim or 1% milk, 2% or reduced-fat cheeses, low-fat ricotta or cottage cheese, or plain low-fat yogurt. Low-sodium or reduced-sodium cheeses. Fats and Oils  Tub margarines without trans fats. Light or reduced-fat mayonnaise and salad dressings (reduced sodium). Avocado. Safflower, olive, or canola oils. Natural peanut or almond butter. Other  Unsalted popcorn and pretzels. The items listed above may not be a complete list of recommended foods or beverages. Contact your dietitian for more options.  What foods are not recommended? Grains  White bread. White pasta. White rice. Refined cornbread. Bagels and croissants. Crackers that contain trans fat. Vegetables  Creamed or fried vegetables. Vegetables in a cheese sauce. Regular canned vegetables. Regular canned tomato sauce and paste. Regular tomato and vegetable juices. Fruits  Canned fruit in light or heavy syrup. Fruit juice. Meat and Other Protein Products  Fatty cuts of meat. Ribs, chicken wings, bacon, sausage, bologna, salami, chitterlings, fatback, hot dogs, bratwurst, and packaged luncheon meats. Salted nuts and seeds. Canned beans with salt. Dairy  Whole or 2% milk, cream, half-and-half, and cream cheese. Whole-fat or sweetened yogurt. Full-fat cheeses   or blue cheese. Nondairy creamers and whipped toppings. Processed cheese, cheese spreads, or cheese curds. Condiments  Onion and garlic  salt, seasoned salt, table salt, and sea salt. Canned and packaged gravies. Worcestershire sauce. Tartar sauce. Barbecue sauce. Teriyaki sauce. Soy sauce, including reduced sodium. Steak sauce. Fish sauce. Oyster sauce. Cocktail sauce. Horseradish. Ketchup and mustard. Meat flavorings and tenderizers. Bouillon cubes. Hot sauce. Tabasco sauce. Marinades. Taco seasonings. Relishes. Fats and Oils  Butter, stick margarine, lard, shortening, ghee, and bacon fat. Coconut, palm kernel, or palm oils. Regular salad dressings. Other  Pickles and olives. Salted popcorn and pretzels. The items listed above may not be a complete list of foods and beverages to avoid. Contact your dietitian for more information.  Where can I find more information? National Heart, Lung, and Blood Institute: www.nhlbi.nih.gov/health/health-topics/topics/dash/ This information is not intended to replace advice given to you by your health care provider. Make sure you discuss any questions you have with your health care provider. Document Released: 01/19/2011 Document Revised: 07/08/2015 Document Reviewed: 12/04/2012 Elsevier Interactive Patient Education  2017 Elsevier Inc.  

## 2016-01-13 NOTE — Progress Notes (Signed)
Pre visit review using our clinic review tool, if applicable. No additional management support is needed unless otherwise documented below in the visit note. 

## 2016-01-13 NOTE — Addendum Note (Signed)
Addended by: Eual FinesBRIDGES, Devin P on: 01/13/2016 05:20 PM   Modules accepted: Orders

## 2016-01-13 NOTE — Assessment & Plan Note (Signed)
Seems healthy but lots of stress Discussed vacation, DASH diet, exercise Tdap and flu vaccines Colon due 2022 Defer PSA to next year

## 2016-01-13 NOTE — Assessment & Plan Note (Signed)
Would consider meds if it has really gone up Will let him try lifestyle

## 2016-01-13 NOTE — Progress Notes (Signed)
Subjective:    Patient ID: Gabriel [name], male    DOB: Jul 25, 1960, 55 y.o.   MRN: [id number]  HPI Here for physical  Decided not to take the losartan Just didn't want to take it  Hasn't been doing the lifestyle things like he should Stress with son having to move back in with him (break up with girlfriend) Now has other grandson living with him (55 year old)-- issues with DSS with his parents Not taking care of himself as much Fast food, etc Libido off Not taking regular time off  Having a lot of pain in knees in past 6 weeks No change in work--regularly up and down ladders Trying osteo-biflex---has been inconsistent  Hasn't felt energetic-- feels "sluggish" Takes the alprazolam only at night---1/2 prn Will drink 6-8 beers several times a week  Current Outpatient Prescriptions on File Prior to Visit  Medication Sig Dispense Refill  . ALPRAZolam (XANAX) 0.5 MG tablet TAKE 1 TABLET BY MOUTH TWICE A DAY AS NEEDED FOR NERVES AS DIRECTED. 60 tablet 0  . NEXIUM 40 MG capsule TAKE ONE (1) CAPSULE BY MOUTH EACH DAY 90 capsule 3   No current facility-administered medications on file prior to visit.     Allergies  Allergen Reactions  . Codeine Sulfate     REACTION: Itching    Past Medical History:  Diagnosis Date  . Arthritis    left hand  . GERD (gastroesophageal reflux disease)   . History of MRSA infection    after chain saw accident  . Hyperlipemia     Past Surgical History:  Procedure Laterality Date  . COLONOSCOPY  09/05/10   normal    Family History  Problem Relation Age of Onset  . Hypertension Mother   . Asthma Mother   . Arthritis Maternal Grandmother     rheumatoid  . Arthritis Paternal Grandmother     rheumatoid  . Arthritis Paternal Grandfather     rheumatoid  . Colon cancer Neg Hx   . Obesity Brother   . Hypertension Brother     Social History   Social History  . Marital status: Married    Spouse name: N/A  . Number of children: 3    . Years of education: N/A   Occupational History  . 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 Smoking status: Former Games developermoker  . Smokeless tobacco: Never Used     Comment: quit 12/10  . Alcohol use 14.4 oz/week    24 Cans of beer per week     Comment: occ  . Drug use: No  . Sexual activity: Not on file   Other Topics Concern  . Not on file   Social History Narrative  . No narrative on file   Review of Systems  Constitutional: Positive for fatigue. Negative for unexpected weight change.       Wears seat belt  HENT: Negative for dental problem, hearing loss and tinnitus.        Keeps up with the dentist  Eyes: Negative for visual disturbance.       No diplopia or unilateral vision loss  Respiratory: Negative for cough, chest tightness and shortness of breath.   Cardiovascular: Negative for chest pain, palpitations and leg swelling.  Gastrointestinal: Negative for abdominal pain, blood in stool, constipation, nausea and vomiting.       No heartburn  Endocrine: Negative for polydipsia and polyuria.  Genitourinary:  Negative for difficulty urinating and urgency.  Musculoskeletal: Positive for arthralgias. Negative for back pain and joint swelling.       Knees and hands  Skin: Negative for rash.       No suspicious lesions  Allergic/Immunologic: Negative for environmental allergies and immunocompromised state.  Neurological: Negative for syncope, light-headedness and headaches.       Slight dizziness if he doesn't eat right  Hematological: Negative for adenopathy. Does not bruise/bleed easily.  Psychiatric/Behavioral: Positive for sleep disturbance. Negative for dysphoric mood. The patient is not nervous/anxious.        Objective:   Physical Exam  Constitutional: He is oriented to person, place, and time. He appears well-developed and well-nourished. No distress.  HENT:  Head: Normocephalic and atraumatic.  Right Ear: External ear normal.   Left Ear: External ear normal.  Mouth/Throat: Oropharynx is clear and moist. No oropharyngeal exudate.  Eyes: Conjunctivae and EOM are normal. Pupils are equal, round, and reactive to light.  Neck: Normal range of motion. Neck supple. No thyromegaly present.  Cardiovascular: Normal rate, regular rhythm, normal heart sounds and intact distal pulses.  Exam reveals no gallop.   No murmur heard. Pulmonary/Chest: Effort normal and breath sounds normal. No respiratory distress. He has no wheezes. He has no rales.  Abdominal: Soft. There is no tenderness.  Musculoskeletal: He exhibits no edema or tenderness.  Knees--no effusion. No ligament or meniscus findings  Lymphadenopathy:    He has no cervical adenopathy.  Neurological: He is alert and oriented to person, place, and time.  Skin: No rash noted. No erythema.  Psychiatric: He has a normal mood and affect. His behavior is normal.          Assessment & Plan:

## 2016-01-13 NOTE — Assessment & Plan Note (Signed)
Uses alprazolam at times

## 2016-01-13 NOTE — Assessment & Plan Note (Signed)
Quiet on the nexium 

## 2016-01-14 LAB — CBC WITH DIFFERENTIAL/PLATELET
Basophils Absolute: 0 10*3/uL (ref 0.0–0.1)
Basophils Relative: 0.5 % (ref 0.0–3.0)
Eosinophils Absolute: 0.2 10*3/uL (ref 0.0–0.7)
Eosinophils Relative: 2.7 % (ref 0.0–5.0)
HCT: 41 % (ref 39.0–52.0)
Hemoglobin: 14.1 g/dL (ref 13.0–17.0)
Lymphocytes Relative: 33.6 % (ref 12.0–46.0)
Lymphs Abs: 2.2 10*3/uL (ref 0.7–4.0)
MCHC: 34.4 g/dL (ref 30.0–36.0)
MCV: 93.3 fl (ref 78.0–100.0)
Monocytes Absolute: 0.4 10*3/uL (ref 0.1–1.0)
Monocytes Relative: 6.9 % (ref 3.0–12.0)
Neutro Abs: 3.6 10*3/uL (ref 1.4–7.7)
Neutrophils Relative %: 56.3 % (ref 43.0–77.0)
Platelets: 212 10*3/uL (ref 150.0–400.0)
RBC: 4.39 Mil/uL (ref 4.22–5.81)
RDW: 13.2 % (ref 11.5–15.5)
WBC: 6.4 10*3/uL (ref 4.0–10.5)

## 2016-01-14 LAB — LIPID PANEL
Cholesterol: 254 mg/dL — ABNORMAL HIGH (ref 0–200)
HDL: 36.6 mg/dL — ABNORMAL LOW (ref >39.00)
NonHDL: 217.8
Total CHOL/HDL Ratio: 7
Triglycerides: 269 mg/dL — ABNORMAL HIGH (ref 0.0–149.0)
VLDL: 53.8 mg/dL — ABNORMAL HIGH (ref 0.0–40.0)

## 2016-01-14 LAB — COMPREHENSIVE METABOLIC PANEL
ALT: 20 U/L (ref 0–53)
AST: 18 U/L (ref 0–37)
Albumin: 4.4 g/dL (ref 3.5–5.2)
Alkaline Phosphatase: 68 U/L (ref 39–117)
BUN: 19 mg/dL (ref 6–23)
CO2: 24 mEq/L (ref 19–32)
Calcium: 9.6 mg/dL (ref 8.4–10.5)
Chloride: 107 mEq/L (ref 96–112)
Creatinine, Ser: 1.24 mg/dL (ref 0.40–1.50)
GFR: 64.13 mL/min (ref >60.00)
Glucose, Bld: 88 mg/dL (ref 70–99)
Potassium: 4.5 mEq/L (ref 3.5–5.1)
Sodium: 142 mEq/L (ref 135–145)
Total Bilirubin: 0.3 mg/dL (ref 0.2–1.2)
Total Protein: 7.4 g/dL (ref 6.0–8.3)

## 2016-01-14 LAB — T4, FREE: Free T4: 0.74 ng/dL (ref 0.60–1.60)

## 2016-01-14 LAB — LDL CHOLESTEROL, DIRECT: Direct LDL: 159 mg/dL

## 2016-01-17 ENCOUNTER — Encounter: Payer: Self-pay | Admitting: *Deleted

## 2016-03-06 ENCOUNTER — Other Ambulatory Visit: Payer: Self-pay | Admitting: Internal Medicine

## 2016-07-24 ENCOUNTER — Other Ambulatory Visit: Payer: Self-pay | Admitting: Internal Medicine

## 2016-07-24 NOTE — Telephone Encounter (Signed)
Last filled 06-01-15 #60 Last OV 01-13-16 No Future OV  No UDS

## 2016-07-25 NOTE — Telephone Encounter (Signed)
Approved: #[id number] x [id number] 

## 2016-07-25 NOTE — Telephone Encounter (Signed)
Left refill on voice mail at pharmacy  

## 2017-02-19 ENCOUNTER — Ambulatory Visit: Payer: Self-pay | Admitting: Family Medicine

## 2017-02-19 ENCOUNTER — Ambulatory Visit (INDEPENDENT_AMBULATORY_CARE_PROVIDER_SITE_OTHER)
Admission: RE | Admit: 2017-02-19 | Discharge: 2017-02-19 | Disposition: A | Discharge status: Home / Self Care | Payer: Self-pay | Source: Ambulatory Visit | Attending: Family Medicine | Admitting: Family Medicine

## 2017-02-19 ENCOUNTER — Encounter: Payer: Self-pay | Admitting: Family Medicine

## 2017-02-19 VITALS — BP 126/82 | HR 75 | Temp 97.4°F | Ht 69.5 in | Wt 226.8 lb

## 2017-02-19 DIAGNOSIS — M25562 Pain in left knee: Secondary | ICD-10-CM

## 2017-02-19 MED ORDER — MELOXICAM 15 MG PO TABS: 15.0000 mg | ORAL_TABLET | Freq: Every day | ORAL | 0 refills | Status: DC | PRN

## 2017-02-19 NOTE — Progress Notes (Signed)
Subjective:    Patient ID: CHI GARLOW, male    DOB: [birth year], 57 y.o.   MRN: [id number]  HPI Her for pain of L leg after an injury   Sept/oct he was helping MIL move- jumping in and out of the truck  Pulled something over medial knee Nursed it  Felt better overall  This Saturday- was cleaning baseboards and twisted leg/knee getting up  Pain is a whole lot worse this time  Medial knee is warm to the touch and a bit swollen   He takes 800 mg of advil or 3 aleve or 2 tylenol  Not helping it  Put ice on it -helps briefly   Make worse by bending Cannot get comfortable at night (puts a pillow under it)  Hurts to walk   Has hx of arthritis in L hand (not knee)  Has seen rheumatology for that (Dr Lavenia Atlas) -injections in the past   56yo pt of Dr Alcide Evener Readings from Last 3 Encounters:  02/19/17 226 lb 12 oz (102.9 kg)  01/13/16 221 lb (100.2 kg)  12/15/15 223 lb (101.2 kg)   33.01 kg/m   Unfortunately recently lost his insurance   Patient Active Problem List   Diagnosis Date Noted  . Left knee pain 02/19/2017  . Elevated blood pressure reading without diagnosis of hypertension 12/11/2014  . Routine general medical examination at a health care facility 07/05/2010  . Sleep disturbance 03/27/2007  . Hyperlipemia 04/23/2006  . GERD 04/23/2006   Past Medical History:  Diagnosis Date  . Arthritis    left hand  . GERD (gastroesophageal reflux disease)   . History of MRSA infection    after chain saw accident  . Hyperlipemia    Past Surgical History:  Procedure Laterality Date  . COLONOSCOPY  09/05/10   normal   Social History   Tobacco Use  . Smoking status: Former Games developer  . Smokeless tobacco: Never Used  . Tobacco comment: quit 12/10  Substance Use Topics  . Alcohol use: Yes    Alcohol/week: 14.4 oz    Types: 24 Cans of beer per week    Comment: occ  . Drug use: No   Family History  Problem Relation Age of Onset  . Hypertension  Mother   . Asthma Mother   . Anemia Mother   . Obesity Brother   . Hypertension Brother   . Arthritis Maternal Grandmother        rheumatoid  . Arthritis Paternal Grandmother        rheumatoid  . Arthritis Paternal Grandfather        rheumatoid  . Colon cancer Neg Hx    Allergies  Allergen Reactions  . Codeine Sulfate     REACTION: Itching   Current Outpatient Medications on File Prior to Visit  Medication Sig Dispense Refill  . ALPRAZolam (XANAX) 0.5 MG tablet TAKE ONE TABLET BY MOUTH TWICE DAILY AS NEEDED 60 tablet 0  . NEXIUM 40 MG capsule TAKE ONE (1) CAPSULE BY MOUTH EACH DAY 90 capsule 3   No current facility-administered medications on file prior to visit.     Review of Systems  Constitutional: Negative for activity change, appetite change, fatigue, fever and unexpected weight change.  HENT: Negative for congestion, rhinorrhea, sore throat and trouble swallowing.   Eyes: Negative for pain, redness, itching and visual disturbance.  Respiratory: Negative for cough, chest tightness, shortness of breath and wheezing.   Cardiovascular: Negative for chest  pain and palpitations.  Gastrointestinal: Negative for abdominal pain, blood in stool, constipation, diarrhea and nausea.  Endocrine: Negative for cold intolerance, heat intolerance, polydipsia and polyuria.  Genitourinary: Negative for difficulty urinating, dysuria, frequency and urgency.  Musculoskeletal: Negative for arthralgias, joint swelling and myalgias.       Pos for knee pain and swelling   Skin: Negative for pallor and rash.  Neurological: Negative for dizziness, tremors, weakness, numbness and headaches.  Hematological: Negative for adenopathy. Does not bruise/bleed easily.  Psychiatric/Behavioral: Negative for decreased concentration and dysphoric mood. The patient is not nervous/anxious.        Objective:   Physical Exam  Constitutional: He appears well-developed and well-nourished. No distress.  obese and  well appearing   HENT:  Head: Normocephalic and atraumatic.  Eyes: Conjunctivae and EOM are normal. Pupils are equal, round, and reactive to light.  Neck: Normal range of motion. Neck supple.  Cardiovascular: Normal rate and regular rhythm.  Pulmonary/Chest: Effort normal and breath sounds normal.  Musculoskeletal: He exhibits edema and tenderness. He exhibits no deformity.       Left knee: He exhibits decreased range of motion, swelling, effusion, bony tenderness and abnormal meniscus. He exhibits no ecchymosis, no deformity, no erythema, normal alignment, no LCL laxity, normal patellar mobility and no MCL laxity. Tenderness found. Medial joint line tenderness noted.  L knee- superficial swelling and small effusion suspected  Some mild swelling posterior to knee  No er No erythema  Mild warmth  No crepitus  Medial joint line tenderness  Flex 30 deg with pain  Ext is full  Neg bounce test  Pos mc murray test Stable on ant drawer/lachman exam  Gait favors R leg   Lymphadenopathy:    He has no cervical adenopathy.  Neurological: He is alert. He exhibits normal muscle tone. Coordination normal.  Skin: Skin is warm and dry. No rash noted. No erythema. No pallor.  Psychiatric: He has a normal mood and affect.          Assessment & Plan:   Problem List Items Addressed This Visit      Other   Left knee pain - Primary    With medial pain /tenderness/swelling and small effusion  Posterior swelling as well  Suspect meniscal injury Xray today  meloxicam 15 mg px to take daily with food  Elevate/ice and compression knee sleeve  Relative rest  May need orthopedic ref  Of note-pt does not have insurance at this time which is unfortunate      Relevant Orders   DG Knee 4 Views W/Patella Left (Completed)

## 2017-02-19 NOTE — Assessment & Plan Note (Signed)
With medial pain /tenderness/swelling and small effusion  Posterior swelling as well  Suspect meniscal injury Xray today  meloxicam 15 mg px to take daily with food  Elevate/ice and compression knee sleeve  Relative rest  May need orthopedic ref  Of note-pt does not have insurance at this time which is unfortunate

## 2017-02-19 NOTE — Patient Instructions (Signed)
Xray of knee today  Elevate/ ice (10 minutes at a time) and use your compression brace when up and around  Take the meloxicam 15 mg daily with food  No more ibuprofen or aleve  Tylenol is fine as directed   We will likely send you to orthopedics -pending xray and how you do

## 2017-02-20 ENCOUNTER — Telehealth: Payer: Self-pay | Admitting: Family Medicine

## 2017-02-20 DIAGNOSIS — G479 Sleep disorder, unspecified: Secondary | ICD-10-CM

## 2017-02-20 DIAGNOSIS — M25562 Pain in left knee: Secondary | ICD-10-CM

## 2017-02-20 NOTE — Telephone Encounter (Signed)
-----   Message from Gabriel MilletShapale M [place], [place] sent at 02/20/2017 11:56 AM EST ----- Dr. Milinda Antisower please call pt to answer his questions regarding his xray

## 2017-02-20 NOTE — Telephone Encounter (Signed)
I spoke to him and will put the referral order in  [facility]   [place]-this patient does not have insurance and would be better to work with an office who is open to payment plan  He also has "care credit" --I'm not sure what that is exactly  Thanks

## 2017-02-21 ENCOUNTER — Ambulatory Visit (INDEPENDENT_AMBULATORY_CARE_PROVIDER_SITE_OTHER): Payer: Self-pay | Admitting: Family

## 2017-02-21 ENCOUNTER — Encounter (INDEPENDENT_AMBULATORY_CARE_PROVIDER_SITE_OTHER): Payer: Self-pay | Admitting: Family

## 2017-02-21 VITALS — Ht 69.0 in | Wt 226.0 lb

## 2017-02-21 DIAGNOSIS — M25562 Pain in left knee: Secondary | ICD-10-CM

## 2017-02-21 MED ORDER — LIDOCAINE HCL 1 % IJ SOLN
5.0000 mL | INTRAMUSCULAR | Status: AC | PRN
  Administered 2017-02-21: 5 mL

## 2017-02-21 MED ORDER — METHYLPREDNISOLONE ACETATE 40 MG/ML IJ SUSP
40.0000 mg | INTRAMUSCULAR | Status: AC | PRN
  Administered 2017-02-21: 40 mg via INTRA_ARTICULAR

## 2017-02-21 NOTE — Telephone Encounter (Signed)
[facility] Wed 02/21/17 at 9:00am and patient aware.

## 2017-02-21 NOTE — Patient Instructions (Signed)
Meniscus Tear A meniscus tear is a knee injury in which a piece of the meniscus is torn. The meniscus is a thick, rubbery, wedge-shaped cartilage in the knee. Two menisci are located in each knee. They sit between the upper bone (femur) and lower bone (tibia) that make up the knee joint. Each meniscus acts as a shock absorber for the knee. A torn meniscus is one of the most common types of knee injuries. This injury can range from mild to severe. Surgery may be needed for a severe tear. What are the causes? This injury may be caused by any squatting, twisting, or pivoting movement. Sports-related injuries are the most common cause. These often occur from:  Running and stopping suddenly.  Changing direction.  Being tackled or knocked off your feet.  As people get older, their meniscus gets thinner and weaker. In these people, tears can happen more easily, such as from climbing stairs. What increases the risk? This injury is more likely to happen to:  People who play contact sports.  Males.  People who are 57?57 years of age.  What are the signs or symptoms? Symptoms of this injury include:  Knee pain, especially at the side of the knee joint. You may feel pain when the injury occurs, or you may only hear a pop and feel pain later.  A feeling that your knee is clicking, catching, locking, or giving way.  Not being able to fully bend or extend your knee.  Bruising or swelling in your knee.  How is this diagnosed? This injury may be diagnosed based on your symptoms and a physical exam. The physical exam may include:  Moving your knee in different ways.  Feeling for tenderness.  Listening for a clicking sound.  Checking if your knee locks or catches.  You may also have tests, such as:  X-rays.  MRI.  A procedure to look inside your knee with a narrow surgical telescope (arthroscopy).  You may be referred to a knee specialist (orthopedic surgeon). How is this  treated? Treatment for this injury depends on the severity of the tear. Treatment for a mild tear may include:  Rest.  Medicine to reduce pain and swelling. This is usually a nonsteroidal anti-inflammatory drug (NSAID).  A knee brace or an elastic sleeve or wrap.  Using crutches or a walker to keep weight off your knee and to help you walk.  Exercises to strengthen your knee (physical therapy).  You may need surgery if you have a severe tear or if other treatments are not working. Follow these instructions at home: Managing pain and swelling  Take over-the-counter and prescription medicines only as told by your health care provider.  If directed, apply ice to the injured area: ? Put ice in a plastic bag. ? Place a towel between your skin and the bag. ? Leave the ice on for 20 minutes, 2-3 times per day.  Raise (elevate) the injured area above the level of your heart while you are sitting or lying down. Activity  Do not use the injured limb to support your body weight until your health care provider says that you can. Use crutches or a walker as told by your health care provider.  Return to your normal activities as told by your health care provider. Ask your health care provider what activities are safe for you.  Perform range-of-motion exercises only as told by your health care provider.  Begin doing exercises to strengthen your knee and leg muscles only   as told by your health care provider. After you recover, your health care provider may recommend these exercises to help prevent another injury. General instructions  Use a knee brace or elastic wrap as told by your health care provider.  Keep all follow-up visits as told by your health care provider. This is important. Contact a health care provider if:  You have a fever.  Your knee becomes red, tender, or swollen.  Your pain medicine is not helping.  Your symptoms get worse or do not improve after 2 weeks of home  care. This information is not intended to replace advice given to you by your health care provider. Make sure you discuss any questions you have with your health care provider. Document Released: 04/22/2002 Document Revised: 07/08/2015 Document Reviewed: 05/25/2014 Elsevier Interactive Patient Education  2018 Elsevier Inc.  

## 2017-02-21 NOTE — Progress Notes (Signed)
Office Visit Note   Patient: Gabriel [name]           Date of Birth: 1960/04/01           MRN: [id number] Visit Date: 02/21/2017              Requested by: Tower, Audrie GallusMarne A, MD [address] [place], [place] [id number] PCP: Karie SchwalbeLetvak, Richard I, MD  Chief Complaint  Patient presents with  . Left Knee - Pain      HPI: The patient is a 57 year old gentleman seen today for evaluation of left knee pain. Has been ongoing for several months. A few months ago got into his truck and had a twisting injury. This got better after several weeks. 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 After painting some baseboard has had return of symptoms, did see PCP for same. Had radiographs of left knee. Has started taking Mobic which he has not seen much improvement with.   Did take Advil and Tylenol previously without relief.  Has tried knee brace without relief.   Assessment & Plan: Visit Diagnoses:  1. Acute pain of left knee     Plan: Follow up in office in 4 weeks if no better. DepoMedrol injection today. Possible meniscal tear v medial sprain  Follow-Up Instructions: Return in about 4 weeks (around 03/21/2017), or if symptoms worsen or fail to improve.   Left Knee Exam   Muscle Strength  The patient has normal left knee strength.  Tenderness  The patient is experiencing tenderness in the medial joint line.  Range of Motion  The patient has normal left knee ROM.  Tests  Varus: negative Valgus: positive  Other  Erythema: absent Swelling: mild Effusion: effusion present      Patient is alert, oriented, no adenopathy, well-dressed, normal affect, normal respiratory effort.   Imaging: No results found. No images are attached to the encounter.  Labs: Lab Results  Component Value Date   ESRSEDRATE 17 11/19/2008    @LABSALLVALUES (HGBA1)@  Body mass index is 33.37 kg/m.  Orders:  No orders of the defined types were placed in this encounter.  No  orders of the defined types were placed in this encounter.    Procedures: Large Joint Inj: L knee on 02/21/2017 11:12 AM Indications: pain Details: 18 G 1.5 in needle, anteromedial approach Medications: 5 mL lidocaine 1 %; 40 mg methylPREDNISolone acetate 40 MG/ML Consent was given by the patient.      Clinical Data: No additional findings.  ROS:  All other systems negative, except as noted in the HPI. Review of Systems  Constitutional: Negative for chills and fever.  Musculoskeletal: Positive for arthralgias and joint swelling.  Skin: Negative for color change.  Neurological: Negative for weakness.    Objective: Vital Signs: Ht 5\' 9"  (1.753 m)   Wt 226 lb (102.5 kg)   BMI 33.37 kg/m   Specialty Comments:  No specialty comments available.  PMFS History: Patient Active Problem List   Diagnosis Date Noted  . Left knee pain 02/19/2017  . Elevated blood pressure reading without diagnosis of hypertension 12/11/2014  . Routine general medical examination at a health care facility 07/05/2010  . Sleep disturbance 03/27/2007  . Hyperlipemia 04/23/2006  . GERD 04/23/2006   Past Medical History:  Diagnosis Date  . Arthritis    left hand  . GERD (gastroesophageal reflux disease)   . History of MRSA infection    after chain saw accident  .  Hyperlipemia     Family History  Problem Relation Age of Onset  . Hypertension Mother   . Asthma Mother   . Anemia Mother   . Obesity Brother   . Hypertension Brother   . Arthritis Maternal Grandmother        rheumatoid  . Arthritis Paternal Grandmother        rheumatoid  . Arthritis Paternal Grandfather        rheumatoid  . Colon cancer Neg Hx     Past Surgical History:  Procedure Laterality Date  . COLONOSCOPY  09/05/10   normal   Social History   Occupational History  . 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 Smoking status: Former Games developer  . Smokeless tobacco: Never Used  .  Tobacco comment: quit 12/10  Substance and Sexual Activity  . Alcohol use: Yes    Alcohol/week: 14.4 oz    Types: 24 Cans of beer per week    Comment: occ  . Drug use: No  . Sexual activity: Not on file

## 2017-03-27 ENCOUNTER — Ambulatory Visit: Payer: Self-pay | Admitting: *Deleted

## 2017-03-27 NOTE — Telephone Encounter (Signed)
Pt c/o that is b/p has been elevated a couple of times since last week. He also said last week he felt a little "swimmy headed" from going up and down on a ladder. So he went to the drug store and checked his b/p which was 159/107 at that time. Checked it today it was 164/108. No other symptoms. He is not on b/p medications.   No appointment available. He is going to the Urgent Care now to be assessed.  Reason for Disposition . Systolic BP  >= 160 OR Diastolic >= 100  Answer Assessment - Initial Assessment Questions 1. BLOOD PRESSURE: "What is the blood pressure?" "Did you take at least two measurements 5 minutes apart?"     164/108 2. ONSET: "When did you take your blood pressure?"     30 mins ago 3. HOW: "How did you obtain the blood pressure?" (e.g., visiting nurse, automatic home BP monitor)     Automatic home bp monitor 4. HISTORY: "Do you have a history of high blood pressure?"     No, has family hx 5. MEDICATIONS: "Are you taking any medications for blood pressure?" "Have you missed any doses recently?"     no 6. OTHER SYMPTOMS: "Do you have any symptoms?" (e.g., headache, chest pain, blurred vision, difficulty breathing, weakness)     no 7. PREGNANCY: "Is there any chance you are pregnant?" "When was your last menstrual period?"     no  Protocols used: HIGH BLOOD PRESSURE-A-AH

## 2017-03-27 NOTE — Telephone Encounter (Signed)
Please check on him in the morning

## 2017-03-28 NOTE — Telephone Encounter (Signed)
Left message for pt to call office.  PEC, please ask pt how his blood pressure is doing today and how he feels and document it in the note. Thanks

## 2017-04-02 MED ORDER — LOSARTAN POTASSIUM-HCTZ 50-12.5 MG PO TABS: 1.0000 | ORAL_TABLET | Freq: Every day | ORAL | 0 refills | Status: DC

## 2017-04-02 NOTE — Telephone Encounter (Signed)
Spoke to pt. He said his BP is still elevated 150-160s/100s. He is without insurance right now. He is asking if he could get a medication on the [facility] list [facility] [address] and schedule an appt in the next month. He should have insurance sometime next month.

## 2017-04-02 NOTE — Telephone Encounter (Signed)
Spoke to pt. Advised him his BP med was sent to the pharmacy. I made him an appt 04-27-17.

## 2017-04-02 NOTE — Addendum Note (Signed)
Addended by: Eual FinesBRIDGES, Nephtali P on: 04/02/2017 05:16 PM   Modules accepted: Orders

## 2017-04-02 NOTE — Telephone Encounter (Signed)
Send Rx for losartan/HCTZ 50/12.5 if on list---otherwise lisinopril 10/12.5 Set up appt Let him know we have reduced charges

## 2017-04-04 ENCOUNTER — Telehealth: Payer: Self-pay | Admitting: *Deleted

## 2017-04-04 NOTE — Telephone Encounter (Signed)
It is also a problem that he called for more medication He needs an appt and to not be taking medication---then I can document normal BP without Rx

## 2017-04-04 NOTE — Telephone Encounter (Signed)
Why does he have a refill of BP med from yesterday

## 2017-04-04 NOTE — Telephone Encounter (Signed)
He is the one who called us saying his BP was elevated and needed a medication like he had in the past to help bring it down. So, we did as he asked and sent in a BP medication. He purchased insurance this week. He is afraid they will have a pre-existing condition clause due to elevated blood pressure.  I told him yesterday that his elevated BP numbers were in his chart from a few years ago and the ones he called us about recently. So , if they were to ask, we would have to provide that information. We cannot write a note stating his BP is normal if he have documentation stating otherwise, agreed.?

## 2017-04-04 NOTE — Telephone Encounter (Signed)
Made appt 04-11-17

## 2017-04-04 NOTE — Telephone Encounter (Signed)
Copied from CRM [phone]. Topic: General - Other >> Apr 04, 2017 12:37 PM Debroah LoopLander, Lumin L wrote: Reason for CRM: Patient is requesting a letter from Dr. Alphonsus SiasLetvak to give to insurance company stating that his bp is normal and his bp medications have been discontinued. Please call back to discuss.

## 2017-04-04 NOTE — Telephone Encounter (Signed)
[facility], so he was not able to get it.

## 2017-04-11 ENCOUNTER — Encounter: Payer: Self-pay | Admitting: Internal Medicine

## 2017-04-11 ENCOUNTER — Ambulatory Visit: Payer: Self-pay | Admitting: Internal Medicine

## 2017-04-11 VITALS — BP 132/90 | HR 68 | Temp 97.7°F | Wt 226.0 lb

## 2017-04-11 DIAGNOSIS — I10 Essential (primary) hypertension: Secondary | ICD-10-CM

## 2017-04-11 MED ORDER — ESOMEPRAZOLE MAGNESIUM 40 MG PO CPDR: DELAYED_RELEASE_CAPSULE | ORAL | 3 refills | Status: DC

## 2017-04-11 MED ORDER — LOSARTAN POTASSIUM-HCTZ 50-12.5 MG PO TABS: 1.0000 | ORAL_TABLET | Freq: Every day | ORAL | 3 refills | Status: DC

## 2017-04-11 MED ORDER — ALPRAZOLAM 0.5 MG PO TABS: 0.5000 mg | ORAL_TABLET | Freq: Two times a day (BID) | ORAL | 0 refills | Status: DC | PRN
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
## 2017-04-11 NOTE — Assessment & Plan Note (Signed)
BP Readings from Last 3 Encounters:  04/11/17 132/90  02/19/17 126/82  01/13/16 122/90   Repeat by me 166/100 Didn't bring in his machine to correlate--will have him do it next time The symptoms are likely from elevated BP Will restart med---this time losartan/HCTZ

## 2017-04-11 NOTE — Patient Instructions (Signed)
DASH Eating Plan DASH stands for "Dietary Approaches to Stop Hypertension." The DASH eating plan is a healthy eating plan that has been shown to reduce high blood pressure (hypertension). It may also reduce your risk for type 2 diabetes, heart disease, and stroke. The DASH eating plan may also help with weight loss. What are tips for following this plan? General guidelines  Avoid eating more than 2,300 mg (milligrams) of salt (sodium) a day. If you have hypertension, you may need to reduce your sodium intake to 1,500 mg a day.  Limit alcohol intake to no more than 1 drink a day for nonpregnant women and 2 drinks a day for men. One drink equals 12 oz of beer, 5 oz of wine, or 1 oz of hard liquor.  Work with your health care provider to maintain a healthy body weight or to lose weight. Ask what an ideal weight is for you.  Get at least 30 minutes of exercise that causes your heart to beat faster (aerobic exercise) most days of the week. Activities may include walking, swimming, or biking.  Work with your health care provider or diet and nutrition specialist (dietitian) to adjust your eating plan to your individual calorie needs. Reading food labels  Check food labels for the amount of sodium per serving. Choose foods with less than 5 percent of the Daily Value of sodium. Generally, foods with less than 300 mg of sodium per serving fit into this eating plan.  To find whole grains, look for the word "whole" as the first word in the ingredient list. Shopping  Buy products labeled as "low-sodium" or "no salt added."  Buy fresh foods. Avoid canned foods and premade or frozen meals. Cooking  Avoid adding salt when cooking. Use salt-free seasonings or herbs instead of table salt or sea salt. Check with your health care provider or pharmacist before using salt substitutes.  Do not fry foods. Cook foods using healthy methods such as baking, boiling, grilling, and broiling instead.  Cook with  heart-healthy oils, such as olive, canola, soybean, or sunflower oil. Meal planning   Eat a balanced diet that includes: ? 5 or more servings of fruits and vegetables each day. At each meal, try to fill half of your plate with fruits and vegetables. ? Up to 6-8 servings of whole grains each day. ? Less than 6 oz of lean meat, poultry, or fish each day. A 3-oz serving of meat is about the same size as a deck of cards. One egg equals 1 oz. ? 2 servings of low-fat dairy each day. ? A serving of nuts, seeds, or beans 5 times each week. ? Heart-healthy fats. Healthy fats called Omega-3 fatty acids are found in foods such as flaxseeds and coldwater fish, like sardines, salmon, and mackerel.  Limit how much you eat of the following: ? Canned or prepackaged foods. ? Food that is high in trans fat, such as fried foods. ? Food that is high in saturated fat, such as fatty meat. ? Sweets, desserts, sugary drinks, and other foods with added sugar. ? Full-fat dairy products.  Do not salt foods before eating.  Try to eat at least 2 vegetarian meals each week.  Eat more home-cooked food and less restaurant, buffet, and fast food.  When eating at a restaurant, ask that your food be prepared with less salt or no salt, if possible. What foods are recommended? The items listed may not be a complete list. Talk with your dietitian about what   dietary choices are best for you. Grains Whole-grain or whole-wheat bread. Whole-grain or whole-wheat pasta. Brown rice. Oatmeal. Quinoa. Bulgur. Whole-grain and low-sodium cereals. Pita bread. Low-fat, low-sodium crackers. Whole-wheat flour tortillas. Vegetables Fresh or frozen vegetables (raw, steamed, roasted, or grilled). Low-sodium or reduced-sodium tomato and vegetable juice. Low-sodium or reduced-sodium tomato sauce and tomato paste. Low-sodium or reduced-sodium canned vegetables. Fruits All fresh, dried, or frozen fruit. Canned fruit in natural juice (without  added sugar). Meat and other protein foods Skinless chicken or turkey. Ground chicken or turkey. Pork with fat trimmed off. Fish and seafood. Egg whites. Dried beans, peas, or lentils. Unsalted nuts, nut butters, and seeds. Unsalted canned beans. Lean cuts of beef with fat trimmed off. Low-sodium, lean deli meat. Dairy Low-fat (1%) or fat-free (skim) milk. Fat-free, low-fat, or reduced-fat cheeses. Nonfat, low-sodium ricotta or cottage cheese. Low-fat or nonfat yogurt. Low-fat, low-sodium cheese. Fats and oils Soft margarine without trans fats. Vegetable oil. Low-fat, reduced-fat, or light mayonnaise and salad dressings (reduced-sodium). Canola, safflower, olive, soybean, and sunflower oils. Avocado. Seasoning and other foods Herbs. Spices. Seasoning mixes without salt. Unsalted popcorn and pretzels. Fat-free sweets. What foods are not recommended? The items listed may not be a complete list. Talk with your dietitian about what dietary choices are best for you. Grains Baked goods made with fat, such as croissants, muffins, or some breads. Dry pasta or rice meal packs. Vegetables Creamed or fried vegetables. Vegetables in a cheese sauce. Regular canned vegetables (not low-sodium or reduced-sodium). Regular canned tomato sauce and paste (not low-sodium or reduced-sodium). Regular tomato and vegetable juice (not low-sodium or reduced-sodium). Pickles. Olives. Fruits Canned fruit in a light or heavy syrup. Fried fruit. Fruit in cream or butter sauce. Meat and other protein foods Fatty cuts of meat. Ribs. Fried meat. Bacon. Sausage. Bologna and other processed lunch meats. Salami. Fatback. Hotdogs. Bratwurst. Salted nuts and seeds. Canned beans with added salt. Canned or smoked fish. Whole eggs or egg yolks. Chicken or turkey with skin. Dairy Whole or 2% milk, cream, and half-and-half. Whole or full-fat cream cheese. Whole-fat or sweetened yogurt. Full-fat cheese. Nondairy creamers. Whipped toppings.  Processed cheese and cheese spreads. Fats and oils Butter. Stick margarine. Lard. Shortening. Ghee. Bacon fat. Tropical oils, such as coconut, palm kernel, or palm oil. Seasoning and other foods Salted popcorn and pretzels. Onion salt, garlic salt, seasoned salt, table salt, and sea salt. Worcestershire sauce. Tartar sauce. Barbecue sauce. Teriyaki sauce. Soy sauce, including reduced-sodium. Steak sauce. Canned and packaged gravies. Fish sauce. Oyster sauce. Cocktail sauce. Horseradish that you find on the shelf. Ketchup. Mustard. Meat flavorings and tenderizers. Bouillon cubes. Hot sauce and Tabasco sauce. Premade or packaged marinades. Premade or packaged taco seasonings. Relishes. Regular salad dressings. Where to find more information:  National Heart, Lung, and Blood Institute: www.nhlbi.nih.gov  American Heart Association: www.heart.org Summary  The DASH eating plan is a healthy eating plan that has been shown to reduce high blood pressure (hypertension). It may also reduce your risk for type 2 diabetes, heart disease, and stroke.  With the DASH eating plan, you should limit salt (sodium) intake to 2,300 mg a day. If you have hypertension, you may need to reduce your sodium intake to 1,500 mg a day.  When on the DASH eating plan, aim to eat more fresh fruits and vegetables, whole grains, lean proteins, low-fat dairy, and heart-healthy fats.  Work with your health care provider or diet and nutrition specialist (dietitian) to adjust your eating plan to your individual   calorie needs. This information is not intended to replace advice given to you by your health care provider. Make sure you discuss any questions you have with your health care provider. Document Released: 01/19/2011 Document Revised: 01/24/2016 Document Reviewed: 01/24/2016 Elsevier Interactive Patient Education  2018 Elsevier Inc.  

## 2017-04-27 ENCOUNTER — Ambulatory Visit: Payer: Self-pay | Admitting: Internal Medicine

## 2017-05-23 ENCOUNTER — Ambulatory Visit: Payer: Self-pay | Admitting: Internal Medicine

## 2017-05-30 ENCOUNTER — Encounter: Payer: Self-pay | Admitting: Internal Medicine

## 2017-05-30 ENCOUNTER — Ambulatory Visit: Payer: Self-pay | Admitting: Internal Medicine

## 2017-05-30 VITALS — BP 122/84 | HR 78 | Temp 97.9°F | Ht 69.0 in | Wt 227.0 lb

## 2017-05-30 DIAGNOSIS — I10 Essential (primary) hypertension: Secondary | ICD-10-CM

## 2017-05-30 LAB — RENAL FUNCTION PANEL
Albumin: 4.3 g/dL (ref 3.5–5.2)
BUN: 20 mg/dL (ref 6–23)
CO2: 27 mEq/L (ref 19–32)
Calcium: 9.7 mg/dL (ref 8.4–10.5)
Chloride: 101 mEq/L (ref 96–112)
Creatinine, Ser: 1.14 mg/dL (ref 0.40–1.50)
GFR: 70.32 mL/min (ref >60.00)
Glucose, Bld: 96 mg/dL (ref 70–99)
Phosphorus: 3.9 mg/dL (ref 2.3–4.6)
Potassium: 4.5 mEq/L (ref 3.5–5.1)
Sodium: 137 mEq/L (ref 135–145)

## 2017-05-30 MED ORDER — SILDENAFIL CITRATE 20 MG PO TABS: 60.0000 mg | ORAL_TABLET | Freq: Every day | ORAL | 11 refills | Status: DC | PRN

## 2017-05-30 NOTE — Progress Notes (Signed)
Subjective:    Patient ID: Gabriel [name], male    DOB: 1960/02/26, 57 y.o.   MRN: [id number]  HPI Here for follow up of HTN  When he started the medication--would feel bad with nausea and feeling tired Stopped it for a few days Restarted at night A few iffy mornings ---now feels fine Only nocturia x 1 which is unchanged Now feels he can be more energetic in the daytime  BP at home better 138/84 on average  Has had some sharp right frontal headache--just intermittent Brief  4/8--had spell of bilateral flank pain Then widened out--- lasted 30 minutes Was after eating--?gas No recurrence  Reduced libido Some ED  Current Outpatient Medications on File Prior to Visit  Medication Sig Dispense Refill  . ALPRAZolam (XANAX) 0.5 MG tablet Take 1 tablet (0.5 mg total) by mouth 2 (two) times daily as needed. 60 tablet 0  . Cyanocobalamin (B-12) 1000 MCG TABS Take 1 tablet by mouth daily.    Marland Kitchen esomeprazole (NEXIUM) 40 MG capsule TAKE ONE (1) CAPSULE BY MOUTH EACH DAY 90 capsule 3  . losartan-hydrochlorothiazide (HYZAAR) 50-12.5 MG tablet Take 1 tablet by mouth daily. 90 tablet 3  . vitamin C (ASCORBIC ACID) 500 MG tablet Take 500 mg by mouth daily.     No current facility-administered medications on file prior to visit.     Allergies  Allergen Reactions  . Codeine Sulfate     REACTION: Itching    Past Medical History:  Diagnosis Date  . Arthritis    left hand  . GERD (gastroesophageal reflux disease)   . History of MRSA infection    after chain saw accident  . Hyperlipemia     Past Surgical History:  Procedure Laterality Date  . COLONOSCOPY  09/05/10   normal    Family History  Problem Relation Age of Onset  . Hypertension Mother   . Asthma Mother   . Anemia Mother   . Obesity Brother   . Hypertension Brother   . Arthritis Maternal Grandmother        rheumatoid  . Arthritis Paternal Grandmother        rheumatoid  . Arthritis Paternal Grandfather    rheumatoid  . Colon cancer Neg Hx     Social History   Socioeconomic History  . Marital status: Married    Spouse name: Not on file  . Number of children: 3  . Years of education: Not on file  . Highest education level: Not on file  Occupational History  . 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 Financial resource strain: Not on file  . Food insecurity:    Worry: Not on file    Inability: Not on file  . Transportation needs:    Medical: Not on file    Non-medical: Not on file  Tobacco Use  . Smoking status: Former Games developer  . Smokeless tobacco: Never Used  . Tobacco comment: quit 12/10  Substance and Sexual Activity  . Alcohol use: Yes    Alcohol/week: 14.4 oz    Types: 24 Cans of beer per week    Comment: occ  . Drug use: No  . Sexual activity: Not on file  Lifestyle  . Physical activity:    Days per week: Not on file    Minutes per session: Not on file  . Stress: Not on file  Relationships  . Social connections:    Talks  on phone: Not on file    Gets together: Not on file    Attends religious service: Not on file    Active member of club or organization: Not on file    Attends meetings of clubs or organizations: Not on file    Relationship status: Not on file  . Intimate partner violence:    Fear of current or ex partner: Not on file    Emotionally abused: Not on file    Physically abused: Not on file    Forced sexual activity: Not on file  Other Topics Concern  . Not on file  Social History Narrative  . Not on file   Review of Systems Vision is okay Sleeping fine Appetite is good    Objective:   Physical Exam  Constitutional: He appears well-developed. No distress.  Neck: No thyromegaly present.  Cardiovascular: Normal rate, regular rhythm and normal heart sounds. Exam reveals no gallop.  No murmur heard. Pulmonary/Chest: Effort normal and breath sounds normal. No respiratory distress. He has no wheezes. He  has no rales.  Musculoskeletal: He exhibits no edema.  Lymphadenopathy:    He has no cervical adenopathy.  Psychiatric: He has a normal mood and affect. His behavior is normal.          Assessment & Plan:

## 2017-05-30 NOTE — Assessment & Plan Note (Signed)
BP Readings from Last 3 Encounters:  05/30/17 122/84  04/11/17 132/90  02/19/17 126/82   Is down Tolerating the medication now Will check labs  Trial sildenafil

## 2017-09-04 ENCOUNTER — Telehealth: Payer: Self-pay

## 2017-09-04 NOTE — Telephone Encounter (Signed)
I spoke with pt; the Hyzaar is making pt feel sluggish and drained; difficult to explain how pt is feeling. Pt has been working outside and precautions for med have to stay out of sun. For about a week pt has not been taking the Hyzaar because working out in the sun. Pt restarted med this morning.BP now is 160/99 . No H/A, CP or SOB now; slight dizziness today but today pt does not feel real bad. Pt has been resting this morning and not outside working. [facility] [address] Dr Alphonsus SiasLetvak out of office.Please advise.

## 2017-09-04 NOTE — Telephone Encounter (Signed)
Pt feels ok but he does want to change his med he is okay with waiting until Dr. Alphonsus SiasLetvak advises him on what to do next. Pt feels okay for now his bp on last check was 140/88 so he will keep an eye on it and if it gets any higher before Dr. Alphonsus SiasLetvak responds to message he will f/u with 1st available provider.

## 2017-09-04 NOTE — Telephone Encounter (Signed)
Copied from CRM [phone]. Topic: General - Other >> Sep 04, 2017 10:38 AM Percival SpanishKennedy, Cheryl W wrote:  Pt cal lto say this med is making him feel bad and is asking to try something else   losartan-hydrochlorothiazide (HYZAAR) 50-12.5 MG tablet  Pharmacy [facility] [address] [place]   >> Sep 04, 2017 10:45 AM Peace, Tammy L wrote: Not [place] pt

## 2017-09-04 NOTE — Telephone Encounter (Signed)
Please make sure he is staying cool and drinking enough water  See how he feels  I want to wait for his PCP to make a change if needed - so optimally he should f/u with Dr Alphonsus SiasLetvak when he returns  If symptoms worsen however please come in for a visit with first avail provider  Will cc to PCP

## 2017-09-05 MED ORDER — AMLODIPINE BESYLATE 5 MG PO TABS: 5.0000 mg | ORAL_TABLET | Freq: Every day | ORAL | 0 refills | Status: DC

## 2017-09-05 NOTE — Telephone Encounter (Signed)
Have him change to amlodipine 5mg  daily Have him cut the first 2 in half and take 2.5mg  daily for 4 days--then go up to a full tab if he has no problems. Let him know that I don't think this will drain him in the same way--but some people note a little swelling in their feet.  Should set up to check in office within the next couple of weeks

## 2017-09-05 NOTE — Telephone Encounter (Signed)
Pt notified as instructed and voiced understanding; pt will d/c losartan hctz. Pt will pick up amlodipine 5 mg today at [facility] [address]. Pt said BP this AM was 133/84. Pt scheduled appt 09/20/17 at 4 pm with Dr Alphonsus SiasLetvak. I spoke with Rosey Batheresa pharmacist at [facility] [address] and cancelled refills of losartan HCTZ 50-12.5 mg and phoned in amlodipine 5 mg with Dr Karle StarchLetvak's instructions.

## 2017-09-06 ENCOUNTER — Ambulatory Visit: Payer: Self-pay

## 2017-09-06 NOTE — Telephone Encounter (Signed)
Please see 09/04/17 phone note also.

## 2017-09-06 NOTE — Telephone Encounter (Signed)
Phone call returned to pt.  Reported after being off of Losartan about one week, he started Amlodipine 5 mg, 1/2 tablet at 1:30 PM yesterday.  Stated he started feeling bad during the day yesterday, and started having a headache.  BP at 10:20 PM 7/24 was 146/102, so he took a dose of Losartan.  Reported today, he felt "swimmy-headed", has a headache, and doesn't have any energy.  Denied chest pain or shortness of breath.  Reported he realized he should not have stopped taking his Losartan a week ago.  Voiced concern if the Amlodipine has made him feel worse.  Advised that his symptoms are likely related to his BP not being controled for the past week.  Reported BP's this morning; see assessment below.  Stated he is waiting to take his next Amlodipine "at 1:30 PM, 24 hrs after the 1st dose."  Advised to take the Amlodipine now (9:30 AM), and continue to take it daily in the AM.  Advised will make Dr. Alphonsus SiasLetvak aware of BP and symptoms.  Has a f/u appt. on 09/20/17.  Pt. Advised to call back if worsening symptoms, and that Dr. Karle StarchLetvak's nurse may call him with further recommendations.  Verb. Understanding.       Reason for Disposition . [1] Systolic BP  >= 130 OR Diastolic >= 80 AND [2] taking BP medications  Answer Assessment - Initial Assessment Questions 1. BLOOD PRESSURE: "What is the blood pressure?" "Did you take at least two measurements 5 minutes apart?"     6:25 AM 157/95, 149/89 @ 7:11 AM, 149/91 @ 7:30 AM, 149/91 @ 8:20 AM  2. ONSET: "When did you take your blood pressure?"    See above  3. HOW: "How did you obtain the blood pressure?" (e.g., visiting nurse, automatic home BP monitor)     Omron digital machine 4. HISTORY: "Do you have a history of high blood pressure?"     Yes  5. MEDICATIONS: "Are you taking any medications for blood pressure?" "Have you missed any doses recently?"     Stopped Losartan about one week ago; started new medication, Amlodipine 2.5 mg yesterday at 1:30 PM  6. OTHER  SYMPTOMS: "Do you have any symptoms?" (e.g., headache, chest pain, blurred vision, difficulty breathing, weakness)     C/o headache, intermittent swimmy-headed, lack of energy; denied any chest pain or shortness of breath  Protocols used: HIGH BLOOD PRESSURE-A-AH Message from Harlan StainsSade R Williams-Neal sent at 09/06/2017 9:11 AM EDT   Pt states the amlodipine made his blood pressure rise he took a losartan at 10:30 last night . At 10:20 last night 146/102. This morning at 6:25 it was 157/95 , at 8:20 149/91

## 2017-09-07 NOTE — Telephone Encounter (Signed)
Check on him Monday morning Find out what he is taking and how he feels

## 2017-09-10 NOTE — Telephone Encounter (Signed)
Spoke to pt. Amlodipine 5mg  tablet whole. Feeling much better. Working outside and doing well. He has not been checking his BP because he feels better. He was inquiring about taking 1/2 tablet. I advised him we do not know what his numbers are on the whole tablet since her has not been checking it. He will keep a record of his BP up to his appt and then discuss possibly decreasing it, unless he calls back sooner.

## 2017-09-20 ENCOUNTER — Ambulatory Visit: Payer: Self-pay | Admitting: Internal Medicine

## 2018-01-25 ENCOUNTER — Other Ambulatory Visit: Payer: Self-pay | Admitting: Internal Medicine

## 2018-01-25 NOTE — Telephone Encounter (Signed)
Last filled 04-11-17 #60 Last OV 05-30-17 Next OV 02-05-18 [facility] [address]

## 2018-02-05 ENCOUNTER — Encounter: Payer: Self-pay | Admitting: Internal Medicine

## 2018-02-05 DIAGNOSIS — Z0289 Encounter for other administrative examinations: Secondary | ICD-10-CM

## 2018-07-19 ENCOUNTER — Other Ambulatory Visit: Payer: Self-pay | Admitting: Internal Medicine

## 2018-07-22 NOTE — Telephone Encounter (Signed)
Please reschedule PE in the next 1-2 months

## 2018-07-22 NOTE — Telephone Encounter (Signed)
I left a detailed message on patient's voice mail to call back and schedule cpx in 1-2 months. I saw an opening on 09/03/18 at 10:30.

## 2018-07-22 NOTE — Telephone Encounter (Signed)
Last filled 01-25-18 #60 Last OV/CPE 05-30-17 Pt CXL 09-20-17 No Show 02-05-18 No Future [place]

## 2018-09-01 ENCOUNTER — Other Ambulatory Visit: Payer: Self-pay | Admitting: Internal Medicine

## 2018-09-03 ENCOUNTER — Other Ambulatory Visit: Payer: Self-pay

## 2018-09-03 ENCOUNTER — Encounter: Payer: Self-pay | Admitting: Internal Medicine

## 2018-09-03 ENCOUNTER — Ambulatory Visit (INDEPENDENT_AMBULATORY_CARE_PROVIDER_SITE_OTHER): Payer: No Typology Code available for payment source | Admitting: Internal Medicine

## 2018-09-03 VITALS — BP 110/80 | HR 83 | Temp 98.2°F | Ht 69.5 in | Wt 221.0 lb

## 2018-09-03 DIAGNOSIS — K2100 Gastro-esophageal reflux disease with esophagitis, without bleeding: Secondary | ICD-10-CM

## 2018-09-03 DIAGNOSIS — Z0000 Encounter for general adult medical examination without abnormal findings: Secondary | ICD-10-CM

## 2018-09-03 DIAGNOSIS — Z125 Encounter for screening for malignant neoplasm of prostate: Secondary | ICD-10-CM | POA: Diagnosis not present

## 2018-09-03 DIAGNOSIS — G629 Polyneuropathy, unspecified: Secondary | ICD-10-CM | POA: Insufficient documentation

## 2018-09-03 DIAGNOSIS — I10 Essential (primary) hypertension: Secondary | ICD-10-CM

## 2018-09-03 DIAGNOSIS — E785 Hyperlipidemia, unspecified: Secondary | ICD-10-CM

## 2018-09-03 DIAGNOSIS — R200 Anesthesia of skin: Secondary | ICD-10-CM

## 2018-09-03 LAB — CBC
HCT: 44.7 % (ref 39.0–52.0)
Hemoglobin: 15.1 g/dL (ref 13.0–17.0)
MCHC: 33.9 g/dL (ref 30.0–36.0)
MCV: 96.1 fl (ref 78.0–100.0)
Platelets: 227 10*3/uL (ref 150.0–400.0)
RBC: 4.65 Mil/uL (ref 4.22–5.81)
RDW: 13.2 % (ref 11.5–15.5)
WBC: 7.4 10*3/uL (ref 4.0–10.5)

## 2018-09-03 LAB — COMPREHENSIVE METABOLIC PANEL
ALT: 27 U/L (ref 0–53)
AST: 24 U/L (ref 0–37)
Albumin: 4.6 g/dL (ref 3.5–5.2)
Alkaline Phosphatase: 69 U/L (ref 39–117)
BUN: 18 mg/dL (ref 6–23)
CO2: 25 mEq/L (ref 19–32)
Calcium: 9.5 mg/dL (ref 8.4–10.5)
Chloride: 104 mEq/L (ref 96–112)
Creatinine, Ser: 1.3 mg/dL (ref 0.40–1.50)
GFR: 56.6 mL/min — ABNORMAL LOW (ref >60.00)
Glucose, Bld: 106 mg/dL — ABNORMAL HIGH (ref 70–99)
Potassium: 4.6 mEq/L (ref 3.5–5.1)
Sodium: 139 mEq/L (ref 135–145)
Total Bilirubin: 0.5 mg/dL (ref 0.2–1.2)
Total Protein: 7.2 g/dL (ref 6.0–8.3)

## 2018-09-03 LAB — PSA: PSA: 0.41 ng/mL (ref 0.10–4.00)

## 2018-09-03 LAB — T4, FREE: Free T4: 0.61 ng/dL (ref 0.60–1.60)

## 2018-09-03 LAB — VITAMIN B12: Vitamin B-12: 375 pg/mL (ref 211–911)

## 2018-09-03 MED ORDER — ESOMEPRAZOLE MAGNESIUM 40 MG PO CPDR: DELAYED_RELEASE_CAPSULE | ORAL | 3 refills | Status: DC

## 2018-09-03 MED ORDER — AMLODIPINE BESYLATE 5 MG PO TABS: 5.0000 mg | ORAL_TABLET | Freq: Every day | ORAL | 3 refills | Status: DC

## 2018-09-03 NOTE — Assessment & Plan Note (Signed)
No rush to treat now--especially with neuropathy symptoms

## 2018-09-03 NOTE — Assessment & Plan Note (Signed)
Needs to work on fitness Flu vaccine in fall Discussed PSA---will check Colon due 2022

## 2018-09-03 NOTE — Patient Instructions (Signed)
If the lab work doesn't give an answer to the neuropathy, and it continues---let me know and I will set up an appointment with a neurologist

## 2018-09-03 NOTE — Assessment & Plan Note (Signed)
Apparent neuropathy Will check labs Neurology eval if persists

## 2018-09-03 NOTE — Assessment & Plan Note (Signed)
BP Readings from Last 3 Encounters:  09/03/18 110/80  05/30/17 122/84  04/11/17 132/90   Good control Will check labs

## 2018-09-03 NOTE — Assessment & Plan Note (Signed)
Has been off the PPI but has symptoms Will renew

## 2018-09-03 NOTE — Progress Notes (Signed)
Subjective:    Patient ID: Gabriel [name], male    DOB: 1960/07/08, 58 y.o.   MRN: [id number]  HPI Here for physical  He is having trouble with his feet Mom has neuropathy and he thinks he has it also Tingling, pain and stinging Very uncomfortable at times Some trouble with his knees also Works all day on ladders---he has trouble getting into a pair of boots that is comfortable Variable---better today Can tell it "makes me clumsy"  Business is good No recent vacations Wife was laid off--he now has his own insurance (exchange) Stressed out by this Uses the xanax---1/2 at night prn  Ran out of nexium Didn't know at pharmacy that we had approved it in June Eating a lot of tomatoes---affected his stomach some  Current Outpatient Medications on File Prior to Visit  Medication Sig Dispense Refill  . ALPRAZolam (XANAX) 0.5 MG tablet Take 1 tablet by mouth twice daily as needed 60 tablet 0  . amLODipine (NORVASC) 5 MG tablet Take 1 tablet (5 mg total) by mouth daily. 90 tablet 0  . esomeprazole (NEXIUM) 40 MG capsule Take 1 capsule by mouth once daily 90 capsule 0  . sildenafil (REVATIO) 20 MG tablet Take 3-5 tablets (60-100 mg total) by mouth daily as needed. 50 tablet 11  . vitamin C (ASCORBIC ACID) 500 MG tablet Take 500 mg by mouth daily.     No current facility-administered medications on file prior to visit.     Allergies  Allergen Reactions  . Codeine Sulfate     REACTION: Itching    Past Medical History:  Diagnosis Date  . Arthritis    left hand  . GERD (gastroesophageal reflux disease)   . History of MRSA infection    after chain saw accident  . Hyperlipemia     Past Surgical History:  Procedure Laterality Date  . COLONOSCOPY  09/05/10   normal    Family History  Problem Relation Age of Onset  . Hypertension Mother   . Asthma Mother   . Anemia Mother   . Obesity Brother   . Hypertension Brother   . Arthritis Maternal Grandmother    rheumatoid  . Arthritis Paternal Grandmother        rheumatoid  . Arthritis Paternal Grandfather        rheumatoid  . Colon cancer Neg Hx     Social History   Socioeconomic History  . Marital status: Married    Spouse name: Not on file  . Number of children: 3  . Years of education: Not on file  . Highest education level: Not on file  Occupational History  . 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 Financial resource strain: Not on file  . Food insecurity    Worry: Not on file    Inability: Not on file  . Transportation needs    Medical: Not on file    Non-medical: Not on file  Tobacco Use  . Smoking status: Former Games developermoker  . Smokeless tobacco: Never Used  . Tobacco comment: quit 12/10  Substance and Sexual Activity  . Alcohol use: Yes    Alcohol/week: 24.0 standard drinks    Types: 24 Cans of beer per week    Comment: occ  . Drug use: No  . Sexual activity: Not on file  Lifestyle  . Physical activity    Days per week: Not on file    Minutes  per session: Not on file  . Stress: Not on file  Relationships  . Social Herbalist on phone: Not on file    Gets together: Not on file    Attends religious service: Not on file    Active member of club or organization: Not on file    Attends meetings of clubs or organizations: Not on file    Relationship status: Not on file  . Intimate partner violence    Fear of current or ex partner: Not on file    Emotionally abused: Not on file    Physically abused: Not on file    Forced sexual activity: Not on file  Other Topics Concern  . Not on file  Social History Narrative  . Not on file   Review of Systems  Constitutional: Negative for fatigue and unexpected weight change.       Wears seat belt  HENT: Negative for dental problem, hearing loss, tinnitus and trouble swallowing.        Overdue for dentist---lost insurance  Eyes: Negative for visual disturbance.       No  diplopia or unilateral vision loss  Respiratory: Negative for cough, chest tightness and shortness of breath.   Cardiovascular: Negative for chest pain, palpitations and leg swelling.  Gastrointestinal: Negative for anal bleeding and blood in stool.       Has had some watering and acid in mouth (when off the nexium)  Endocrine: Negative for polydipsia and polyuria.  Genitourinary: Negative for difficulty urinating and urgency.       ED is helped by the viagra  Musculoskeletal: Positive for arthralgias. Negative for back pain and joint swelling.  Skin: Negative for rash.  Allergic/Immunologic: Positive for environmental allergies. Negative for immunocompromised state.       Not enough for meds  Neurological: Negative for dizziness, syncope, weakness, light-headedness and headaches.  Hematological: Negative for adenopathy. Does not bruise/bleed easily.  Psychiatric/Behavioral: Negative for dysphoric mood. The patient is not nervous/anxious.        Not sleeping great---not really with daytime somnolence       Objective:   Physical Exam  Constitutional: He is oriented to person, place, and time. He appears well-developed. No distress.  HENT:  Head: Normocephalic and atraumatic.  Right Ear: External ear normal.  Left Ear: External ear normal.  Mouth/Throat: Oropharynx is clear and moist. No oropharyngeal exudate.  Eyes: Pupils are equal, round, and reactive to light. Conjunctivae are normal.  Neck: No thyromegaly present.  Cardiovascular: Normal rate, regular rhythm, normal heart sounds and intact distal pulses. Exam reveals no gallop.  No murmur heard. Respiratory: Effort normal and breath sounds normal. No respiratory distress. He has no wheezes. He has no rales.  GI: Soft. There is no abdominal tenderness.  Musculoskeletal:        General: No tenderness or edema.  Lymphadenopathy:    He has no cervical adenopathy.  Neurological: He is alert and oriented to person, place, and time.   Normal fine touch sensation in feet  Skin: No rash noted. No erythema.  Psychiatric: He has a normal mood and affect. His behavior is normal.           Assessment & Plan:

## 2018-09-06 LAB — PROTEIN ELECTROPHORESIS, SERUM, WITH REFLEX
Albumin ELP: 4.6 g/dL (ref 3.8–4.8)
Alpha 1: 0.3 g/dL (ref 0.2–0.3)
Alpha 2: 0.8 g/dL (ref 0.5–0.9)
Beta 2: 0.4 g/dL (ref 0.2–0.5)
Beta Globulin: 0.4 g/dL (ref 0.4–0.6)
Gamma Globulin: 1 g/dL (ref 0.8–1.7)
Total Protein: 7.4 g/dL (ref 6.1–8.1)

## 2018-09-06 LAB — IFE INTERPRETATION: Immunofix Electr Int: NOT DETECTED

## 2018-10-19 IMAGING — DX DG KNEE COMPLETE 4+V*L*
5 series · 5 of 5 positions shown · non-contrast
Comparison: None.

CLINICAL DATA: Medial left knee pain and swelling after twisting
injury.

EXAM:
LEFT KNEE - COMPLETE 4+ VIEW

[knee ap]
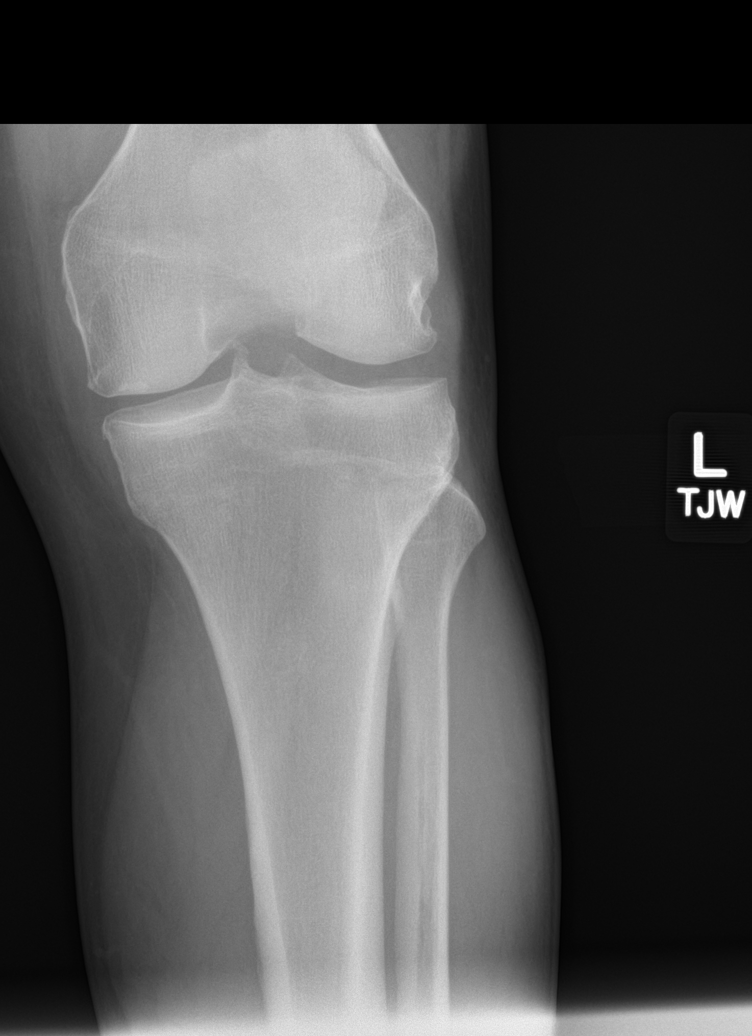

[knee lat]
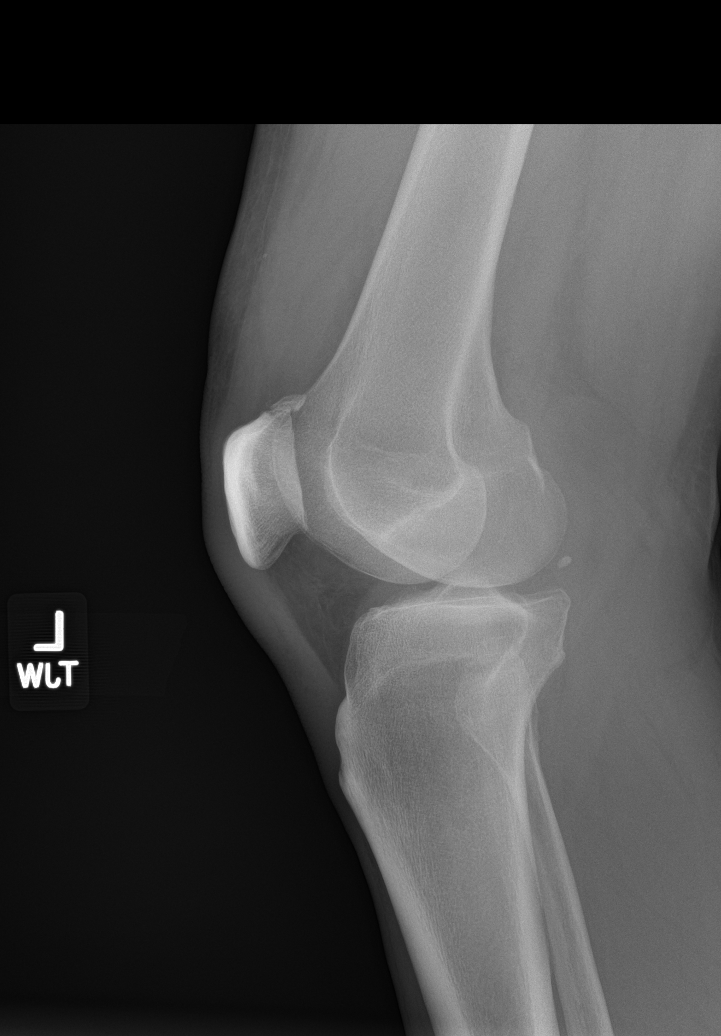

[patella skyline]
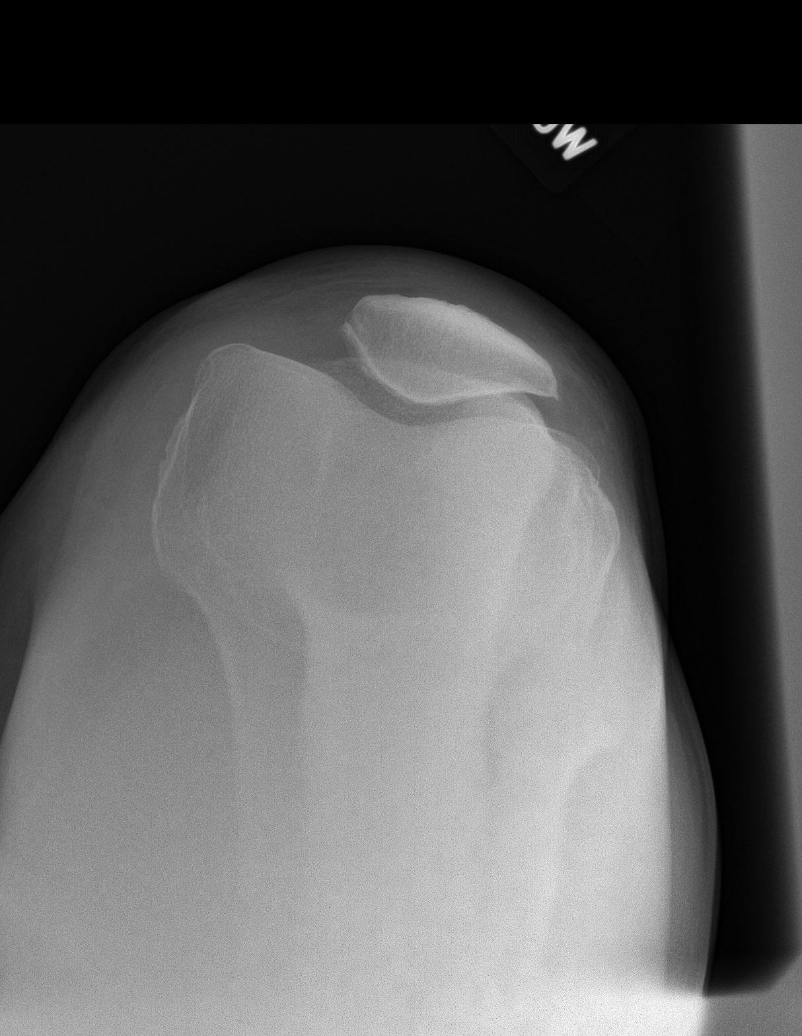

[knee obl (1 of 2)]
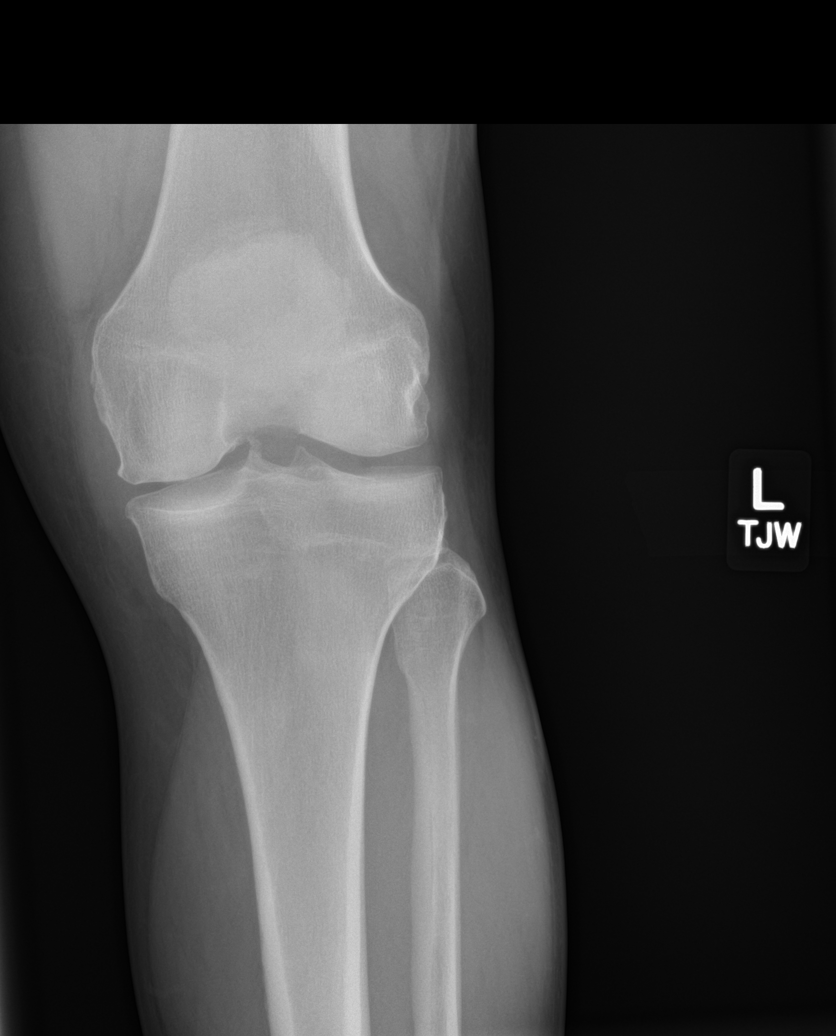

[knee obl (2 of 2)]
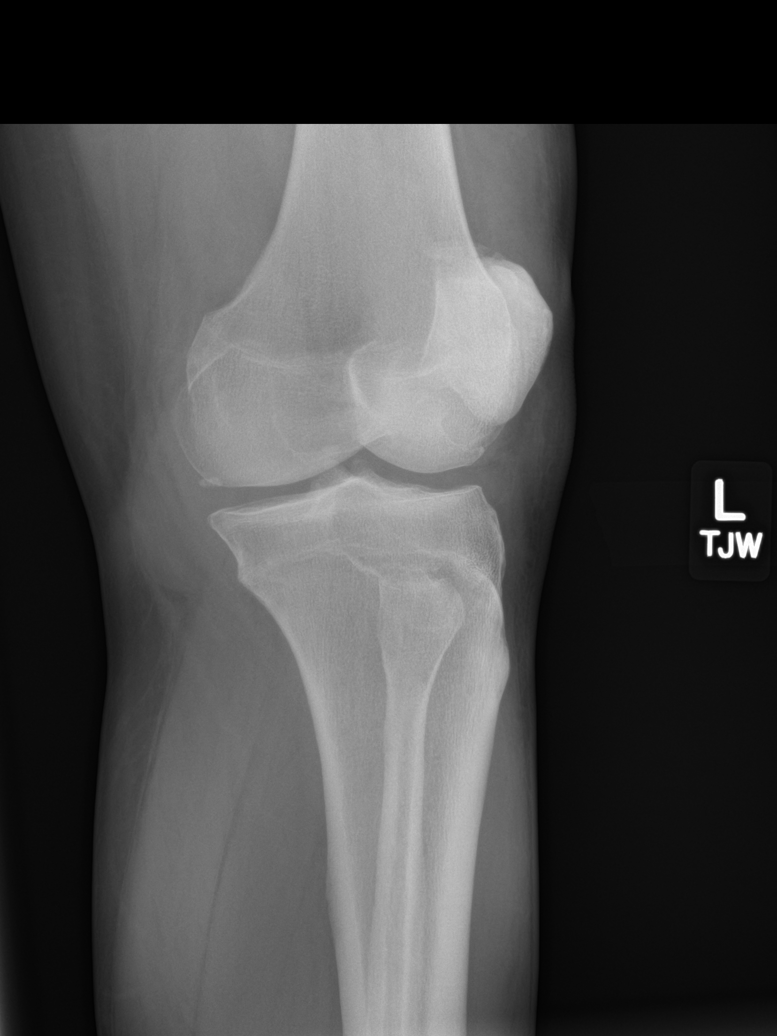

[5 of 5 positions shown; findings below may reference images not displayed]

FINDINGS: Cortical irregularity along the superior patella. Tiny
tricompartmental osteophytes with relative preservation of the joint
spaces. Moderate joint effusion. Bone mineralization is normal. Soft
tissues are unremarkable.
IMPRESSION: 1. Cortical irregularity through a superior patellar osteophyte,
suspicious for fracture. Moderate joint effusion.
These results will be called to the ordering clinician or
representative by the Radiologist Assistant, and communication
documented in the PACS or zVision Dashboard.

## 2018-11-27 ENCOUNTER — Other Ambulatory Visit: Payer: Self-pay | Admitting: Internal Medicine

## 2018-11-27 DIAGNOSIS — G479 Sleep disorder, unspecified: Secondary | ICD-10-CM

## 2018-11-27 NOTE — Telephone Encounter (Signed)
Electronic refill request Alprazolam Last office visit 09/03/18 Last refill 07/22/18 #60 Dr. Silvio Pate is out of the office

## 2018-11-28 NOTE — Telephone Encounter (Signed)
Noted, refill sent to pharmacy. 

## 2019-02-18 ENCOUNTER — Other Ambulatory Visit: Payer: Self-pay | Admitting: Primary Care

## 2019-02-18 DIAGNOSIS — G479 Sleep disorder, unspecified: Secondary | ICD-10-CM

## 2019-02-19 NOTE — Telephone Encounter (Signed)
Last prescribed by Mayra Reel on 11/28/2018 #60 with 0 refills .  Last appointment on  09/03/2018 (CPE).   Next future appointment on 09/08/2019.  [facility] [address]

## 2019-04-28 ENCOUNTER — Telehealth: Payer: Self-pay | Admitting: *Deleted

## 2019-04-28 DIAGNOSIS — G629 Polyneuropathy, unspecified: Secondary | ICD-10-CM

## 2019-04-28 NOTE — Telephone Encounter (Signed)
Referral placed.

## 2019-04-28 NOTE — Telephone Encounter (Signed)
In general, medications for neuropathy are for pain--they don't usually help tingling and numbness. If things are worsening, the next step would be a consultation with a neurologist (find out if he wants to do this---and if so, is [place] okay?)

## 2019-04-28 NOTE — Telephone Encounter (Signed)
Spoke to pt. He does want a referral and Ginette Otto is fine. I told him if he did not hear from anyone by Friday to let us know.

## 2019-04-28 NOTE — Telephone Encounter (Signed)
Patient called stating that when he had his last physical it was discussed the tingling and numbness in his feet. Patient stated that he was advised to get insoles for his shoes. Patient stated that he went to [facility] and was fitted for insoles. Patient stated that helped a little. Patient stated that he is still having numbness and tingling in his feet even when he is not on his feet. Patient stated that he is wondering if he has neuropathy In his feet. Patient stated that his mom and brother both have neuropathy in their feet. Patient wants to know if Dr. Alphonsus Sias would recommend medication for him to try for this? Pharmacy [facility]

## 2019-04-29 ENCOUNTER — Encounter: Payer: Self-pay | Admitting: Neurology

## 2019-06-10 ENCOUNTER — Ambulatory Visit (INDEPENDENT_AMBULATORY_CARE_PROVIDER_SITE_OTHER): Payer: No Typology Code available for payment source | Admitting: Internal Medicine

## 2019-06-10 ENCOUNTER — Telehealth: Payer: Self-pay

## 2019-06-10 ENCOUNTER — Encounter: Payer: Self-pay | Admitting: Internal Medicine

## 2019-06-10 ENCOUNTER — Other Ambulatory Visit: Payer: Self-pay

## 2019-06-10 VITALS — BP 148/98 | HR 78 | Temp 97.9°F | Ht 69.5 in | Wt 234.0 lb

## 2019-06-10 DIAGNOSIS — R42 Dizziness and giddiness: Secondary | ICD-10-CM | POA: Diagnosis not present

## 2019-06-10 DIAGNOSIS — R200 Anesthesia of skin: Secondary | ICD-10-CM | POA: Diagnosis not present

## 2019-06-10 LAB — COMPREHENSIVE METABOLIC PANEL
ALT: 21 U/L (ref 0–53)
AST: 18 U/L (ref 0–37)
Albumin: 4.4 g/dL (ref 3.5–5.2)
Alkaline Phosphatase: 71 U/L (ref 39–117)
BUN: 10 mg/dL (ref 6–23)
CO2: 29 mEq/L (ref 19–32)
Calcium: 9.4 mg/dL (ref 8.4–10.5)
Chloride: 103 mEq/L (ref 96–112)
Creatinine, Ser: 1.07 mg/dL (ref 0.40–1.50)
GFR: 70.68 mL/min (ref >60.00)
Glucose, Bld: 94 mg/dL (ref 70–99)
Potassium: 4.1 mEq/L (ref 3.5–5.1)
Sodium: 139 mEq/L (ref 135–145)
Total Bilirubin: 0.5 mg/dL (ref 0.2–1.2)
Total Protein: 7.2 g/dL (ref 6.0–8.3)

## 2019-06-10 LAB — CBC
HCT: 40.8 % (ref 39.0–52.0)
Hemoglobin: 14.1 g/dL (ref 13.0–17.0)
MCHC: 34.6 g/dL (ref 30.0–36.0)
MCV: 93.4 fl (ref 78.0–100.0)
Platelets: 220 10*3/uL (ref 150.0–400.0)
RBC: 4.36 Mil/uL (ref 4.22–5.81)
RDW: 12.7 % (ref 11.5–15.5)
WBC: 6.1 10*3/uL (ref 4.0–10.5)

## 2019-06-10 LAB — T4, FREE: Free T4: 0.67 ng/dL (ref 0.60–1.60)

## 2019-06-10 LAB — VITAMIN B12: Vitamin B-12: 356 pg/mL (ref 211–911)

## 2019-06-10 MED ORDER — AMLODIPINE BESYLATE 5 MG PO TABS: 5.0000 mg | ORAL_TABLET | Freq: Every day | ORAL | 3 refills | Status: DC

## 2019-06-10 NOTE — Assessment & Plan Note (Signed)
Seems to have some vestibular component but not really vertigo Could be related to the blood pressure Will restart the amlodipine

## 2019-06-10 NOTE — Patient Instructions (Signed)
Please start thiamine 100mg  daily (or 1 over the counter supplement)

## 2019-06-10 NOTE — Telephone Encounter (Signed)
See OV note.  

## 2019-06-10 NOTE — Progress Notes (Signed)
Subjective:    Patient ID: Gabriel [name], male    DOB: June 11, 1960, 59 y.o.   MRN: [id number]  HPI Here due to dizziness This visit occurred during the SARS-CoV-2 public health emergency.  Safety protocols were in place, including screening questions prior to the visit, additional usage of staff PPE, and extensive cleaning of exam room while observing appropriate contact time as indicated for disinfecting solutions.   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 ALPRAZolam (XANAX) 0.5 MG tablet Take 1 tablet by mouth twice daily as needed 60 tablet 0  . esomeprazole (NEXIUM) 40 MG capsule Take 1 capsule by mouth once daily 90 capsule 3  . sildenafil (REVATIO) 20 MG tablet Take 3-5 tablets (60-100 mg total) by mouth daily as needed. 50 tablet 11  . amLODipine (NORVASC) 5 MG tablet Take 1 tablet (5 mg total) by mouth daily. (Patient not taking: Reported on 06/10/2019) 90 tablet 3  . vitamin C (ASCORBIC ACID) 500 MG tablet Take 500 mg by mouth daily.     No current facility-administered medications on file prior to visit.    Allergies  Allergen Reactions  . Codeine Sulfate     REACTION: Itching    Past Medical History:  Diagnosis Date  . Arthritis    left hand  . GERD (gastroesophageal reflux disease)   . History of MRSA infection    after chain saw  accident  . Hyperlipemia     Past Surgical History:  Procedure Laterality Date  . COLONOSCOPY  09/05/10   normal    Family History  Problem Relation Age of Onset  . Hypertension Mother   . Asthma Mother   . Anemia Mother   . Obesity Brother   . Hypertension Brother   . Arthritis Maternal Grandmother        rheumatoid  . Arthritis Paternal Grandmother        rheumatoid  . Arthritis Paternal Grandfather        rheumatoid  . Colon cancer Neg Hx     Social History   Socioeconomic History  . Marital status: Married    Spouse name: Not on file  . Number of children: 3  . Years of education: Not on file  . Highest education level: Not on file  Occupational History  . 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 Smoking status: Former Research scientist (life sciences)  . Smokeless tobacco: Never Used  . Tobacco comment: quit 12/10  Substance and Sexual Activity  . Alcohol use: Yes    Alcohol/week: 24.0 standard drinks    Types: 24 Cans of beer per week    Comment: occ  . Drug use: No  . Sexual activity: Not on file  Other Topics Concern  . Not on file  Social History  Narrative  . Not on file   Social Determinants of Health   Financial Resource Strain:   . Difficulty of Paying Living Expenses:   Food Insecurity:   . Worried About Programme researcher, broadcasting/film/video in the Last Year:   . Barista in the Last Year:   Transportation Needs:   . Freight forwarder (Medical):   Marland Kitchen Lack of Transportation (Non-Medical):   Physical Activity:   . Days of Exercise per Week:   . Minutes of Exercise per Session:   Stress:   . Feeling of Stress :   Social Connections:   . Frequency of Communication with Friends and Family:   . Frequency of Social Gatherings with Friends and Family:   . Attends Religious Services:   . Active Member of Clubs or Organizations:   . Attends Banker Meetings:   Marland Kitchen Marital Status:   Intimate Partner Violence:   . Fear of  Current or Ex-Partner:   . Emotionally Abused:   Marland Kitchen Physically Abused:   . Sexually Abused:    Review of Systems No nausea Sleeps okay--lots of dreams    Objective:   Physical Exam  Constitutional: He appears well-developed. No distress.  Eyes:  ?slight horizontal nystagmus with lateral gaze  Neck: No thyromegaly present.  Cardiovascular: Normal rate and normal heart sounds.  Respiratory: Effort normal and breath sounds normal. No respiratory distress. He has no wheezes.  Musculoskeletal:        General: No edema.  Lymphadenopathy:    He has no cervical adenopathy.  Neurological: He displays a negative Romberg sign. Coordination and gait normal.           Assessment & Plan:

## 2019-06-10 NOTE — Telephone Encounter (Signed)
[facility] Day - Client TELEPHONE ADVICE RECORD [facility] Patient Name: Gabriel [name] Gender: Male DOB: 1960/03/26 Age: 59 Y 2 M 30 D Return Phone Number: [phone] (Primary) Address[place] [address] Client [facility] Day - Client Client Site [facility] - Day Physician Tillman Abide - MD Contact Type Call Who Is Calling Patient / Member / Family / Caregiver Call Type Triage / Clinical Relationship To Patient Self Return Phone Number [phone] (Primary) Chief Complaint Dizziness Reason for Call Symptomatic / Request for Health Information Initial Comment Caller states that he is having alot of dizziness that has lasted for the last 2 weeks. No other symptoms. Translation No Nurse Assessment Nurse: Anner Crete, RN, Olegario Messier Date/Time (Eastern Time): 06/10/2019 8:59:44 AM Confirm and document reason for call. If symptomatic, describe symptoms. ---Caller states that he is having a lot of dizziness that has lasted for the last 2 weeks. No other symptoms. Has the patient had close contact with a person known or suspected to have the novel coronavirus illness OR traveled / lives in area with major community spread (including international travel) in the last 14 days from the onset of symptoms? * If Asymptomatic, screen for exposure and travel within the last 14 days. ---No Does the patient have any new or worsening symptoms? ---Yes Will a triage be completed? ---Yes Related visit to physician within the last 2 weeks? ---No Does the PT have any chronic conditions? (i.e. diabetes, asthma, this includes High risk factors for pregnancy, etc.) ---No Is this a behavioral health or substance abuse call? ---No Guidelines Guideline Title Affirmed Question Affirmed Notes Nurse Date/Time (Eastern Time) Dizziness - Lightheadedness [1] MODERATE dizziness (e.g., interferes with normal  activities) AND [2] has NOT been evaluated by physician for this (Exception: dizziness caused by heat exposure, sudden standing, or poor fluid intake) Anner Crete, RN, Olegario Messier 06/10/2019 9:00:24 AM PLEASE NOTE: All timestamps contained within this report are represented as [place] Standard Time. CONFIDENTIALTY NOTICE: This fax transmission is intended only for the addressee. It contains information that is legally privileged, confidential or otherwise protected from use or disclosure. If you are not the intended recipient, you are strictly prohibited from reviewing, disclosing, copying using or disseminating any of this information or taking any action in reliance on or regarding this information. If you have received this fax in error, please notify us immediately by telephone so that we can arrange for its return to [place]. Phone: [phone], Toll-Free: [phone], Fax: [phone] Page: 2 of 2 Call Id: [id number] Disp. Time Lamount Cohen Time) Disposition Final User 06/10/2019 9:02:50 AM See PCP within 24 Hours Yes Anner Crete, RN, Sammuel Bailiff Disagree/Comply Comply Caller Understands Yes PreDisposition Call Doctor Care Advice Given Per Guideline SEE PCP WITHIN 24 HOURS: * IF OFFICE WILL BE OPEN: You need to be seen within the next 24 hours. Call your doctor (or NP/PA) when the office opens and make an appointment. DRINK FLUIDS: * Drink several glasses of fruit juice, other clear fluids or water. LIE DOWN AND REST: * Lie down with feet elevated for 1 hour. * This will improve circulation and increase blood flow to the brain. CALL BACK IF: * You become worse. CARE ADVICE given per Dizziness (Adult) guideline. Referrals REFERRED TO PCP OFFICE

## 2019-06-10 NOTE — Telephone Encounter (Signed)
Pt already has appt today 06/10/19 at 10:15 with Dr Alphonsus Sias.

## 2019-06-10 NOTE — Assessment & Plan Note (Signed)
Going to neurologist Will check labs Start thiamine

## 2019-06-12 LAB — PROTEIN ELECTROPHORESIS, SERUM, WITH REFLEX
Albumin ELP: 4.3 g/dL (ref 3.8–4.8)
Alpha 1: 0.3 g/dL (ref 0.2–0.3)
Alpha 2: 0.7 g/dL (ref 0.5–0.9)
Beta 2: 0.3 g/dL (ref 0.2–0.5)
Beta Globulin: 0.4 g/dL (ref 0.4–0.6)
Gamma Globulin: 0.9 g/dL (ref 0.8–1.7)
Total Protein: 6.8 g/dL (ref 6.1–8.1)

## 2019-06-16 ENCOUNTER — Telehealth: Payer: Self-pay | Admitting: *Deleted

## 2019-06-16 NOTE — Telephone Encounter (Signed)
Please let him know that those blood pressures are not too bad--and it takes a while for the meds to work fully. I think he should continue the current dose and continue to monitor his BP (hopefully once a day will be enough if he feels okay)

## 2019-06-16 NOTE — Telephone Encounter (Signed)
Patient called stating that he was seen last Tuesday and was put back on blood pressure medication. Patient stated that his blood pressure is still spiking especially in the mornings. Patient stated that he could tell that his blood pressure was up this morning because he felt pressure in his head and a little dizzy.  Patient stated that his blood pressure at 8:45 was 145/90, 9:00 143/86, 9:20 143/84, 9:55 152/91 and 10:15 148/83. Patient stated that he took his blood pressure medication around 6:30-7:00 am. Patient stated that he can tell his blood pressure is coming down and feels much better. Patient was given ER precautions and verbalized understanding. Patient stated that he is wondering if he should double up on the Amlodipine?  Pharmacy [facility]

## 2019-06-16 NOTE — Telephone Encounter (Signed)
Spoke to pt. He will keep [place] updated.

## 2019-06-19 ENCOUNTER — Other Ambulatory Visit: Payer: Self-pay

## 2019-06-19 ENCOUNTER — Encounter: Payer: Self-pay | Admitting: Emergency Medicine

## 2019-06-19 ENCOUNTER — Emergency Department
Admission: EM | Admit: 2019-06-19 | Discharge: 2019-06-19 | Disposition: A | Discharge status: Home / Self Care | Payer: No Typology Code available for payment source | Attending: Emergency Medicine | Admitting: Emergency Medicine

## 2019-06-19 DIAGNOSIS — Z23 Encounter for immunization: Secondary | ICD-10-CM | POA: Diagnosis not present

## 2019-06-19 DIAGNOSIS — S61412A Laceration without foreign body of left hand, initial encounter: Secondary | ICD-10-CM | POA: Diagnosis present

## 2019-06-19 DIAGNOSIS — Y929 Unspecified place or not applicable: Secondary | ICD-10-CM | POA: Diagnosis not present

## 2019-06-19 DIAGNOSIS — Y9389 Activity, other specified: Secondary | ICD-10-CM | POA: Diagnosis not present

## 2019-06-19 DIAGNOSIS — W270XXA Contact with workbench tool, initial encounter: Secondary | ICD-10-CM | POA: Insufficient documentation

## 2019-06-19 DIAGNOSIS — Y999 Unspecified external cause status: Secondary | ICD-10-CM | POA: Diagnosis not present

## 2019-06-19 MED ORDER — LIDOCAINE HCL (PF) 1 % IJ SOLN
5.0000 mL | Freq: Once | INTRAMUSCULAR | Status: AC
  Administered 2019-06-19: 5 mL via INTRADERMAL
  Filled 2019-06-19: qty 5

## 2019-06-19 MED ORDER — CEPHALEXIN 500 MG PO CAPS: 500.0000 mg | ORAL_CAPSULE | Freq: Four times a day (QID) | ORAL | 0 refills | Status: DC

## 2019-06-19 MED ORDER — TETANUS-DIPHTH-ACELL PERTUSSIS 5-2.5-18.5 LF-MCG/0.5 IM SUSP
0.5000 mL | Freq: Once | INTRAMUSCULAR | Status: AC
  Administered 2019-06-19: 11:00:00 0.5 mL via INTRAMUSCULAR
  Filled 2019-06-19: qty 0.5

## 2019-06-19 NOTE — ED Notes (Signed)
See triage note  Presents with laceration to left hand  Laceration noted at base of index finger

## 2019-06-19 NOTE — ED Provider Notes (Signed)
[facility] Emergency Department Provider Note  ____________________________________________  Time seen: Approximately 10:17 AM  I have reviewed the triage vital signs and the nursing notes.   HISTORY  Chief Complaint Laceration    HPI Gabriel [name] is a 59 y.o. male that presents to the emergency department for evaluation of left hand laceration.  Patient cut his hand on a saw.  He states that laceration is shallow and might not even need stitches but given the location, he came to the emergency department to be evaluated.  He thinks his tetanus shot should be up-to-date but is not sure.  He does not think that he needs an x-ray.   Past Medical History:  Diagnosis Date  . Arthritis    left hand  . GERD (gastroesophageal reflux disease)   . History of MRSA infection    after chain saw accident  . Hyperlipemia     Patient Active Problem List   Diagnosis Date Noted  . Dizziness 06/10/2019  . Sensory loss 09/03/2018  . Left knee pain 02/19/2017  . Essential hypertension, benign 12/11/2014  . Routine general medical examination at a health care facility 07/05/2010  . Sleep disturbance 03/27/2007  . Hyperlipemia 04/23/2006  . GERD 04/23/2006    Past Surgical History:  Procedure Laterality Date  . COLONOSCOPY  09/05/10   normal    Prior to Admission medications   Medication Sig Start Date End Date Taking? Authorizing Provider  ALPRAZolam Prudy Feeler) 0.5 MG tablet Take 1 tablet by mouth twice daily as needed 02/19/19   Tillman Abide I, MD  amLODipine (NORVASC) 5 MG tablet Take 1 tablet (5 mg total) by mouth daily. 06/10/19   Karie Schwalbe, MD  cephALEXin (KEFLEX) 500 MG capsule Take 1 capsule (500 mg total) by mouth 4 (four) times daily for 10 days. 06/19/19 06/29/19  Enid Derry, PA-C  esomeprazole (NEXIUM) 40 MG capsule Take 1 capsule by mouth once daily 09/03/18   Tillman Abide I, MD  sildenafil (REVATIO) 20 MG tablet Take 3-5 tablets (60-100  mg total) by mouth daily as needed. 05/30/17   Karie Schwalbe, MD  vitamin C (ASCORBIC ACID) 500 MG tablet Take 500 mg by mouth daily.    [provider]    Allergies Codeine sulfate  Family History  Problem Relation Age of Onset  . Hypertension Mother   . Asthma Mother   . Anemia Mother   . Obesity Brother   . Hypertension Brother   . Arthritis Maternal Grandmother        rheumatoid  . Arthritis Paternal Grandmother        rheumatoid  . Arthritis Paternal Grandfather        rheumatoid  . Colon cancer Neg Hx     Social History Social History   Tobacco Use  . Smoking status: Former Games developer  . Smokeless tobacco: Never Used  . Tobacco comment: quit 12/10  Substance Use Topics  . Alcohol use: Yes    Alcohol/week: 24.0 standard drinks    Types: 24 Cans of beer per week    Comment: occ  . Drug use: No     Review of Systems  Constitutional: No fever/chills Respiratory: No SOB. Gastrointestinal: No nausea, no vomiting.  Musculoskeletal: Positive for hand pain. Skin: Negative for rash, ecchymosis.  Positive for laceration. Neurological: Negative for numbness or tingling   ____________________________________________   PHYSICAL EXAM:  VITAL SIGNS: ED Triage Vitals  Enc Vitals Group     BP 06/19/19  9163 (!) 146/80     Pulse Rate 06/19/19 0933 80     Resp 06/19/19 0933 20     Temp 06/19/19 0933 98.3 F (36.8 C)     Temp Source 06/19/19 0933 Oral     SpO2 06/19/19 0933 99 %     Weight 06/19/19 0929 234 lb (106.1 kg)     Height 06/19/19 0929 5\' 9"  (1.753 m)     Head Circumference --      Peak Flow --      Pain Score 06/19/19 0928 1     Pain Loc --      Pain Edu? --      Excl. in GC? --      Constitutional: Alert and oriented. Well appearing and in no acute distress. Eyes: Conjunctivae are normal. PERRL. EOMI. Head: Atraumatic. ENT:      Ears:      Nose: No congestion/rhinnorhea.      Mouth/Throat: Mucous membranes are moist.  Neck: No  stridor.   Cardiovascular: Normal rate, regular rhythm.  Good peripheral circulation. Respiratory: Normal respiratory effort without tachypnea or retractions. Lungs CTAB. Good air entry to the bases with no decreased or absent breath sounds. Musculoskeletal: Full range of motion to all extremities. No gross deformities appreciated. Neurologic:  Normal speech and language. No gross focal neurologic deficits are appreciated.  Skin:  Skin is warm, dry.  1 cm shallow linear well approximated laceration to left hand at the base of the lateral left index finger. Psychiatric: Mood and affect are normal. Speech and behavior are normal. Patient exhibits appropriate insight and judgement.   ____________________________________________   LABS (all labs ordered are listed, but only abnormal results are displayed)  Labs Reviewed - No data to display ____________________________________________  EKG   ____________________________________________  RADIOLOGY  No results found.  ____________________________________________    PROCEDURES  Procedure(s) performed:    Procedures  LACERATION REPAIR Performed by: 08/19/19  Consent: Verbal consent obtained.  Consent given by: patient  Prepped and Draped in normal sterile fashion  Wound explored: No foreign bodies   Laceration Location: hand  Laceration Length: 1 cm  Anesthesia: None  Local anesthetic: lidocaine 1% without epinephrine  Anesthetic total: 2 ml  Irrigation method: syringe  Amount of cleaning: Enid Derry normal saline  Skin closure: 4-0 nylon  Number of sutures: 6  Technique: Simple interrupted  Patient tolerance: Patient tolerated the procedure well with no immediate complications.  Medications  Tdap (BOOSTRIX) injection 0.5 mL (0.5 mLs Intramuscular Given 06/19/19 1127)  lidocaine (PF) (XYLOCAINE) 1 % injection 5 mL (5 mLs Intradermal Given 06/19/19 1127)      ____________________________________________   INITIAL IMPRESSION / ASSESSMENT AND PLAN / ED COURSE  Pertinent labs & imaging results that were available during my care of the patient were reviewed by me and considered in my medical decision making (see chart for details).  Review of the [place] CSRS was performed in accordance of the [facility] prior to dispensing any controlled drugs.   Patient presented to the emergency department for evaluation of finger laceration.  Vital signs and exam are reassuring.  Laceration was cleaned with normal saline and iodine.  Laceration was repaired with stitches.  Patient declines x-ray.  Tetanus shot was updated.  Finger splint was placed.  Patient will be discharged home with prescriptions for Neosporin. Patient is to follow up with primary care as directed. Patient is given ED precautions to return to the ED for any worsening or new symptoms.  Gabriel [name] was evaluated in Emergency Department on 06/19/2019 for the symptoms described in the history of present illness. He was evaluated in the context of the global COVID-19 pandemic, which necessitated consideration that the patient might be at risk for infection with the SARS-CoV-2 virus that causes COVID-19. Institutional protocols and algorithms that pertain to the evaluation of patients at risk for COVID-19 are in a state of rapid change based on information released by regulatory bodies including the CDC and federal and state organizations. These policies and algorithms were followed during the patient's care in the ED.  ____________________________________________  FINAL CLINICAL IMPRESSION(S) / ED DIAGNOSES  Final diagnoses:  Laceration of left hand without foreign body, initial encounter      NEW MEDICATIONS STARTED DURING THIS VISIT:  ED Discharge Orders         Ordered    cephALEXin (KEFLEX) 500 MG capsule  4 times daily     06/19/19 1134              This chart was dictated  using voice recognition software/Dragon. Despite best efforts to proofread, errors can occur which can change the meaning. Any change was purely unintentional.    Laban Emperor, PA-C 06/19/19 1244    Lavonia Drafts, MD 06/19/19 1255

## 2019-06-19 NOTE — ED Triage Notes (Signed)
Pt reports was working with his buddy and was holding a bolt and pliers and his partner was cutting it and it slipped and cut his left hand at the base of index finger. Approx 1 in laceration noted. Bleeding controlled at this time.

## 2019-06-20 NOTE — Progress Notes (Signed)
NEUROLOGY CONSULTATION NOTE  Gabriel [name] MRN: [id number] DOB: Oct 27, 1960  Referring provider: Viviana Simpler, MD Primary care provider: Viviana Simpler, MD  Reason for consult:  neuropathy  HISTORY OF PRESENT ILLNESS: Gabriel [name] is a 59 year old left-handed white male who presents for neuropathy.  History supplemented by referring provider note.  Last fall, he started having tingling and numbness in the feet with shooting pain in toes.  Discomfort while sitting or walking.  He first changed shoes with insoles which helped but still had symptoms.  He reports history of alcohol use (30 plus beers a week) which he stopped 50 days ago, and started thiamine.  He reports feet has improved since then.  No back pain or pain shooting down the legs.  No leg weakness.  He sometimes notes a little numbness and coldness in his fingers but no tingling.  No bowel or bladder dysfunction.    Labs from 06/10/2019 demonstrated B12 356; free T4 0.67; normal SPEP pattern; CMP with Na 139, K 4.1, Cl 103, CO2 29, glucose 94, BUN 10, Cr 1.07, t bili 0.5, ALP 71, AST 18, ALT 21.  Mother and brother both have neuropathy.   He works in Architect.   PAST MEDICAL HISTORY: Past Medical History:  Diagnosis Date  . Arthritis    left hand  . GERD (gastroesophageal reflux disease)   . History of MRSA infection    after chain saw accident  . Hyperlipemia     PAST SURGICAL HISTORY: Past Surgical History:  Procedure Laterality Date  . COLONOSCOPY  09/05/10   normal    MEDICATIONS: Current Outpatient Medications on File Prior to Visit  Medication Sig Dispense Refill  . ALPRAZolam (XANAX) 0.5 MG tablet Take 1 tablet by mouth twice daily as needed 60 tablet 0  . amLODipine (NORVASC) 5 MG tablet Take 1 tablet (5 mg total) by mouth daily. 90 tablet 3  . cephALEXin (KEFLEX) 500 MG capsule Take 1 capsule (500 mg total) by mouth 4 (four) times daily for 10 days. 40 capsule 0  . esomeprazole  (NEXIUM) 40 MG capsule Take 1 capsule by mouth once daily 90 capsule 3  . sildenafil (REVATIO) 20 MG tablet Take 3-5 tablets (60-100 mg total) by mouth daily as needed. 50 tablet 11  . vitamin C (ASCORBIC ACID) 500 MG tablet Take 500 mg by mouth daily.     No current facility-administered medications on file prior to visit.    ALLERGIES: Allergies  Allergen Reactions  . Codeine Sulfate     REACTION: Itching    FAMILY HISTORY: Family History  Problem Relation Age of Onset  . Hypertension Mother   . Asthma Mother   . Anemia Mother   . Obesity Brother   . Hypertension Brother   . Arthritis Maternal Grandmother        rheumatoid  . Arthritis Paternal Grandmother        rheumatoid  . Arthritis Paternal Grandfather        rheumatoid  . Colon cancer Neg Hx     SOCIAL HISTORY: Social History   Socioeconomic History  . Marital status: Married    Spouse name: Not on file  . Number of children: 3  . Years of education: Not on file  . Highest education level: Not on file  Occupational History  . 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 Smoking status: Former Research scientist (life sciences)  . Smokeless tobacco:  Never Used  . Tobacco comment: quit 12/10  Substance and Sexual Activity  . Alcohol use: Yes    Alcohol/week: 24.0 standard drinks    Types: 24 Cans of beer per week    Comment: occ  . Drug use: No  . Sexual activity: Not on file  Other Topics Concern  . Not on file  Social History Narrative  . Not on file   Social Determinants of Health   Financial Resource Strain:   . Difficulty of Paying Living Expenses:   Food Insecurity:   . Worried About Programme researcher, broadcasting/film/video in the Last Year:   . Barista in the Last Year:   Transportation Needs:   . Freight forwarder (Medical):   Marland Kitchen Lack of Transportation (Non-Medical):   Physical Activity:   . Days of Exercise per Week:   . Minutes of Exercise per Session:   Stress:   . Feeling of  Stress :   Social Connections:   . Frequency of Communication with Friends and Family:   . Frequency of Social Gatherings with Friends and Family:   . Attends Religious Services:   . Active Member of Clubs or Organizations:   . Attends Banker Meetings:   Marland Kitchen Marital Status:   Intimate Partner Violence:   . Fear of Current or Ex-Partner:   . Emotionally Abused:   Marland Kitchen Physically Abused:   . Sexually Abused:     PHYSICAL EXAM: Blood pressure 138/74, pulse 83, height 5\' 9"  (1.753 m), weight 232 lb 12.8 oz (105.6 kg), SpO2 98 %. General: No acute distress.  Patient appears well-groomed.   Head:  Normocephalic/atraumatic Eyes:  fundi examined but not visualized Neck: supple, no paraspinal tenderness, full range of motion Back: No paraspinal tenderness Heart: regular rate and rhythm Lungs: Clear to auscultation bilaterally. Vascular: No carotid bruits. Neurological Exam: Mental status: alert and oriented to person, place, and time, recent and remote memory intact, fund of knowledge intact, attention and concentration intact, speech fluent and not dysarthric, language intact. Cranial nerves: CN I: not tested CN II: pupils equal, round and reactive to light, visual fields intact CN III, IV, VI:  full range of motion, no nystagmus, no ptosis CN V: facial sensation intact CN VII: upper and lower face symmetric CN VIII: hearing intact CN IX, X: gag intact, uvula midline CN XI: sternocleidomastoid and trapezius muscles intact CN XII: tongue midline Bulk & Tone: normal, no fasciculations. Motor:  5/5 throughout  Sensation:  Pinprick and vibration sensation intact.  Deep Tendon Reflexes:  2+ throughout,toes downgoing.   Finger to nose testing:  Without dysmetria.   Heel to shin:  Without dysmetria.   Gait:  Normal station and stride.  Able to turn and tandem walk. Romberg negative.  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  May have been related to alcohol-use/low  thiamine.  Improved now.  PLAN: Would have him continue to monitor.  If worse, he was encouraged to follow up and we can pursue further testing ([facility])  Thank you for allowing me to take part in the care of this patient.  , DO  CC: Shon Millet, MD

## 2019-06-23 ENCOUNTER — Ambulatory Visit (INDEPENDENT_AMBULATORY_CARE_PROVIDER_SITE_OTHER): Payer: No Typology Code available for payment source | Admitting: Neurology

## 2019-06-23 ENCOUNTER — Other Ambulatory Visit: Payer: Self-pay

## 2019-06-23 ENCOUNTER — Encounter: Payer: Self-pay | Admitting: Neurology

## 2019-06-23 VITALS — BP 138/74 | HR 83 | Ht 69.0 in | Wt 232.8 lb

## 2019-06-23 DIAGNOSIS — G629 Polyneuropathy, unspecified: Secondary | ICD-10-CM

## 2019-06-23 NOTE — Patient Instructions (Signed)
Continue current management If symptoms get worse, follow up

## 2019-06-24 ENCOUNTER — Telehealth: Payer: Self-pay

## 2019-06-24 NOTE — Telephone Encounter (Signed)
Spoke to pt. Able to move his finger. Laceration healing well.

## 2019-07-03 ENCOUNTER — Telehealth: Payer: Self-pay | Admitting: Internal Medicine

## 2019-07-03 NOTE — Telephone Encounter (Signed)
His visit was a month ago---I really think he needs to have it looked at again. Dizziness is rarely related to an ear infection---but pain is

## 2019-07-03 NOTE — Telephone Encounter (Signed)
Left message on VM per DPR with message from Dr Alphonsus Sias.

## 2019-07-03 NOTE — Telephone Encounter (Signed)
I would recommend he make an appt for further evaluation, unable to advise him what to do without seeing it.

## 2019-07-03 NOTE — Telephone Encounter (Signed)
Pt still having issues with his left ear - still having pain.  Pt feels that he may have developed an ear infection now.  He states that Dr Alphonsus Sias was aware at his last OV 06/10/19 but wanted to focus on his BP at that time to see if that was the route cause for the ear discomfort/dizziness - advised him to let us know if this worsened.  Pt c/o dull achy pain in left ear, jaw pain/discomfort.  Denies runny nose, sore throat, fever.   Pt has been taking Ibuprofen for the pain.   Please advise, thanks.

## 2019-07-04 NOTE — Telephone Encounter (Signed)
Spoke with patient.  He was hoping we could just call in an abx but understands why he needs to be seen in person.  He is just concerned with his insurance coverage and financial needs.   I did offer patient an appt with Dr. Alphonsus Sias on Monday as he has a few openings at that this time.  Patient refused stating he wants to see how the weekend goes and will call us first thing Monday am if he wants to try and get in.   At this point, he is having slight vertigo and mild pain/ache that sometimes radiates into jaw from ear but is not severe.  If worsens over weekend he will go to urgent care.  He understands to call us ASAP on Monday am if he wants to get in as appts fill up fast.  I did encourage him to go ahead and make appt for eval given nature of symptoms and need for a physical evaluation to see the ear to know for sure.  Patient continues to refuse to set up appt at this time.   FYI to Dr. Alphonsus Sias

## 2019-07-05 NOTE — Telephone Encounter (Signed)
Please check on him first thing Monday morning and offer appt if needed--I can always add on at the end of the day also prn

## 2019-07-07 ENCOUNTER — Ambulatory Visit (INDEPENDENT_AMBULATORY_CARE_PROVIDER_SITE_OTHER): Payer: No Typology Code available for payment source | Admitting: Internal Medicine

## 2019-07-07 ENCOUNTER — Other Ambulatory Visit: Payer: Self-pay

## 2019-07-07 ENCOUNTER — Encounter: Payer: Self-pay | Admitting: Internal Medicine

## 2019-07-07 DIAGNOSIS — M26622 Arthralgia of left temporomandibular joint: Secondary | ICD-10-CM | POA: Diagnosis not present

## 2019-07-07 DIAGNOSIS — M26629 Arthralgia of temporomandibular joint, unspecified side: Secondary | ICD-10-CM | POA: Insufficient documentation

## 2019-07-07 NOTE — Telephone Encounter (Signed)
Pt had apt with PCP this am.

## 2019-07-07 NOTE — Progress Notes (Signed)
Subjective:    Patient ID: Gabriel [name], male    DOB: October 31, 1960, 59 y.o.   MRN: [id number]  HPI Here due to ear pain This visit occurred during the SARS-CoV-2 public health emergency.  Safety protocols were in place, including screening questions prior to the visit, additional usage of staff PPE, and extensive cleaning of exam room while observing appropriate contact time as indicated for disinfecting solutions.   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 ALPRAZolam (XANAX) 0.5 MG tablet Take 1 tablet by mouth twice daily as needed 60 tablet 0  . amLODipine (NORVASC) 5 MG tablet Take 1 tablet (5 mg total) by mouth daily. 90 tablet 3  . esomeprazole (NEXIUM) 40 MG capsule Take 1 capsule by mouth once daily 90 capsule 3  . sildenafil (REVATIO) 20 MG tablet Take 3-5 tablets (60-100 mg total) by mouth daily as needed. 50 tablet 11  . vitamin C (ASCORBIC ACID) 500 MG tablet Take 500 mg by mouth daily.     No current facility-administered medications on file prior to visit.    Allergies  Allergen Reactions  . Codeine Sulfate     REACTION: Itching    Past Medical History:  Diagnosis Date  . Arthritis    left hand  . GERD (gastroesophageal reflux disease)   . History of MRSA infection    after chain saw accident  . Hyperlipemia     Past Surgical History:  Procedure Laterality Date  . COLONOSCOPY  09/05/10   normal    Family History  Problem Relation Age of Onset  . Hypertension Mother   . Asthma Mother   . Anemia Mother   . Obesity Brother   . Hypertension Brother   . Arthritis Maternal Grandmother        rheumatoid  . Arthritis Paternal Grandmother        rheumatoid  . Arthritis Paternal  Grandfather        rheumatoid  . Colon cancer Neg Hx     Social History   Socioeconomic History  . Marital status: Married    Spouse name: Not on file  . Number of children: 3  . Years of education: Not on file  . Highest education level: Not on file  Occupational History  . 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 Smoking status: Former Research scientist (life sciences)  . Smokeless tobacco: Never Used  . Tobacco comment: quit 12/10  Substance and Sexual Activity  . Alcohol use: Yes    Alcohol/week: 24.0 standard drinks    Types: 24 Cans of beer per week    Comment: occ  . Drug use: No  . Sexual activity: Not on file  Other Topics Concern  . Not on file  Social History Narrative   Left handed   Lives with wife and three kids one story home   Social Determinants of Health   Financial Resource Strain:   . Difficulty of Paying Living Expenses:   Food Insecurity:   . Worried About Charity fundraiser in the Last Year:   . Arboriculturist in the Last Year:   Transportation Needs:   . Lack of Transportation (  Medical):   Marland Kitchen Lack of Transportation (Non-Medical):   Physical Activity:   . Days of Exercise per Week:   . Minutes of Exercise per Session:   Stress:   . Feeling of Stress :   Social Connections:   . Frequency of Communication with Friends and Family:   . Frequency of Social Gatherings with Friends and Family:   . Attends Religious Services:   . Active Member of Clubs or Organizations:   . Attends Banker Meetings:   Marland Kitchen Marital Status:   Intimate Partner Violence:   . Fear of Current or Ex-Partner:   . Emotionally Abused:   Marland Kitchen Physically Abused:   . Sexually Abused:    Review of Systems No fever No pain with chewing Slight dizziness with sudden movements--no vertigo    Objective:   Physical Exam  HENT:  Normal ear canals and TMs No tragal tenderness Mild TMJ sensitivity on left with chewing  No oral lesions and teeth look  fine  Neck: No thyromegaly present.  Lymphadenopathy:       Head (right side): No submandibular, no preauricular and no posterior auricular adenopathy present.       Head (left side): No submandibular, no preauricular and no posterior auricular adenopathy present.    He has no cervical adenopathy.  Neurological: He displays a negative Romberg sign. Coordination and gait normal.           Assessment & Plan:

## 2019-07-07 NOTE — Assessment & Plan Note (Addendum)
Doubt teeth are a problem--they look good No known trauma  Doesn't think he grinds his teeth No clear explanation for the mild dizziness (consider neurology if worsens)  Okay to use heat, continue the ibuprofen Reassured--not ear infection Recommended routine dental evaluation

## 2019-07-07 NOTE — Patient Instructions (Signed)

## 2019-07-25 ENCOUNTER — Other Ambulatory Visit: Payer: Self-pay

## 2019-07-25 ENCOUNTER — Telehealth: Payer: Self-pay | Admitting: Neurology

## 2019-07-25 DIAGNOSIS — G629 Polyneuropathy, unspecified: Secondary | ICD-10-CM

## 2019-07-25 MED ORDER — GABAPENTIN 100 MG PO CAPS: 100.0000 mg | ORAL_CAPSULE | Freq: Three times a day (TID) | ORAL | 0 refills | Status: DC

## 2019-07-25 NOTE — Telephone Encounter (Signed)
I would like to order NCV-EMG of lower extremities. For nerve pain, we can start gabapentin 100mg  three times daily.

## 2019-07-25 NOTE — Telephone Encounter (Signed)
Patient called in wanting to speak with someone about his symptoms. He is still having numbness in the balls of his feet. He would like to see if there may be any medication options for him.

## 2019-07-28 ENCOUNTER — Other Ambulatory Visit: Payer: Self-pay

## 2019-07-28 DIAGNOSIS — G629 Polyneuropathy, unspecified: Secondary | ICD-10-CM

## 2019-07-28 NOTE — Telephone Encounter (Signed)
Order changed to [facility].

## 2019-07-28 NOTE — Telephone Encounter (Signed)
Spoke with patient about scheduling the EMG and he does not want to wait until Aug. He would like to have this done as soon as possible so please refer to somewhere that could get him in sooner than Aug.

## 2019-07-28 NOTE — Telephone Encounter (Signed)
I called patient to get them schedule for the EMG and had to leave a message. We will try back later

## 2019-07-30 ENCOUNTER — Other Ambulatory Visit: Payer: Self-pay

## 2019-07-30 ENCOUNTER — Telehealth: Payer: Self-pay | Admitting: Neurology

## 2019-07-30 MED ORDER — DULOXETINE HCL 30 MG PO CPEP
ORAL_CAPSULE | ORAL | 0 refills | Status: DC
Start: 2019-07-30 — End: 2019-10-06

## 2019-07-30 NOTE — Telephone Encounter (Signed)
He should no longer take the gabapentin.  Instead, I would like to prescribe him duloxetine:  take 30mg  at bedtime for one week, then increase to 60mg  at bedtime.  I would like him to hold off on starting the medication until he is feeling better.

## 2019-07-30 NOTE — Telephone Encounter (Signed)
Called pt to advise on stopping gabapentin and starting duloxetine at prescribed dose but to wait until he is feeling back to baseline before beginning medication, verbalized understanding.

## 2019-07-30 NOTE — Telephone Encounter (Signed)
Patient called and states that he started the gabapentin last week, he is reporting that the bottom of his feet and legs are swelling , skin feels tight and the legs may have been warm to the touch.  Please call

## 2019-09-08 ENCOUNTER — Encounter: Payer: No Typology Code available for payment source | Admitting: Internal Medicine

## 2019-09-29 ENCOUNTER — Other Ambulatory Visit: Payer: Self-pay | Admitting: Internal Medicine

## 2019-09-29 DIAGNOSIS — G479 Sleep disorder, unspecified: Secondary | ICD-10-CM

## 2019-09-29 NOTE — Telephone Encounter (Signed)
Last filled 02-19-19 #60 Last OV 07-07-19 Next OV 10-16-19 [facility] [address]

## 2019-10-03 ENCOUNTER — Telehealth: Payer: Self-pay

## 2019-10-03 NOTE — Telephone Encounter (Signed)
Please have him continue to monitor over the weekend and let me know if it stays up (this is certainly not an immediately worrisome or dangerous elevation). Make sure he is sitting with back supported and feet down for a few minutes, and relaxed, before taking it.

## 2019-10-03 NOTE — Telephone Encounter (Signed)
Left detailed message on VM per DPR. Advised him I would call him on Monday to check in.

## 2019-10-03 NOTE — Telephone Encounter (Signed)
Pt calling stating he is taking amlodipine 5 mg and his BP has been around 150/85.  Says he has had some dizziness and feeling "swimmy headed".  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  So he is not sure if sxs may be due to that.  Please advise.

## 2019-10-06 ENCOUNTER — Telehealth (INDEPENDENT_AMBULATORY_CARE_PROVIDER_SITE_OTHER): Payer: No Typology Code available for payment source | Admitting: Family Medicine

## 2019-10-06 ENCOUNTER — Encounter: Payer: Self-pay | Admitting: Family Medicine

## 2019-10-06 DIAGNOSIS — R42 Dizziness and giddiness: Secondary | ICD-10-CM

## 2019-10-06 MED ORDER — AMLODIPINE BESYLATE 5 MG PO TABS: 7.5000 mg | ORAL_TABLET | Freq: Every day | ORAL | Status: DC

## 2019-10-06 NOTE — Telephone Encounter (Signed)
Patient called stating that he has been real dizzy since yesterday. Patient stated that his blood pressure when he first got up this morning was 144/84. Patient stated that he is real dizzy and sluggish.  Patient checked his blood pressure while on the phone it was 142/81 heart rate 67. Patient stated that he does not have any covid symptoms, but his son is at a doctor's office now being checked for covid and he has been around him. Patient scheduled for a virtual visit with Dr. Para March today at 10:00 am. Patient was given ER precautions and verbalized understanding.

## 2019-10-06 NOTE — Telephone Encounter (Signed)
Noted. Thanks.

## 2019-10-06 NOTE — Progress Notes (Signed)
Virtual visit completed through WebEx or similar program Patient location: home  Provider location: Daisy at [facility], office  Participants: Patient and me (unless stated otherwise below)  Pandemic considerations d/w pt.   Limitations and rationale for visit method d/w patient.  Patient agreed to proceed.   CC: vertigo/mult sx.    HPI:    Son is getting checked for covid this AM, results pending.  His son is Nature conservation officer.  Cautions d/w pt.    A few years ago pt was dx'd with HTN.  He usually feels dizzy when his BP is elevated and he moves his head.  He had episode this spring with vertigo and his BP was elevated.  He got back on amlodipine in the meantime. He improved at that point.     Pt's sx started a few days ago.  BP was elevated recently.  He is a Education administrator, working outside.  Still with vertigo sx.  BP this AM was 161/83 at 7:45 this AM.    Per patient report, historically when BP has been controlled his vertigo is better.    No syncope.  No presyncope.  He clearly has h/o room spinning.  No ear pain.  No CP, not SOB.  No BLE edema.  No cough, no fevers, no taste or smell changes.    Meds and allergies reviewed.   ROS: Per HPI unless specifically indicated in ROS section   NAD Speech wnl  EOMI, normal facial sensation, normal shoulder shrug.  Normal smile.   Speech wnl.    A/P:  D/w pt about options.  Would try 7.5mg  amlodipine daily and try home vertigo exercises.  BP cautions d/w pt.   D/w pt about PO fluids.  covid cautions d/w pt.   Vertigo exercises d/w pt.   He'll update [place] about sx and BP in a few days.

## 2019-10-06 NOTE — Telephone Encounter (Signed)
Those BP readings are okay. Will await Dr Lianne Bushy evaluation

## 2019-10-07 ENCOUNTER — Other Ambulatory Visit: Payer: Self-pay | Admitting: Internal Medicine

## 2019-10-09 ENCOUNTER — Telehealth: Payer: Self-pay

## 2019-10-09 MED ORDER — MECLIZINE HCL 12.5 MG PO TABS: 12.5000 mg | ORAL_TABLET | Freq: Three times a day (TID) | ORAL | 0 refills | Status: DC | PRN

## 2019-10-09 MED ORDER — AMLODIPINE BESYLATE 5 MG PO TABS: 10.0000 mg | ORAL_TABLET | Freq: Every day | ORAL | Status: DC

## 2019-10-09 NOTE — Telephone Encounter (Signed)
You had a video visit 10/06/2019.Marland KitchenMarland KitchenPt called to report that the dizziness has not improved and that he actually feels worse, no new Sx to report... The last time he checked his BP was Tues and it was 150s/88... pt states that the dizziness is worse with movement of his head as he looks up and down while working and it is making it difficult for him to work.. Please advise

## 2019-10-09 NOTE — Telephone Encounter (Signed)
Would try inc amlodipine to 10mg  a day and see if BP/vertigo improve.  Can try concurrent meclizine prn for vertigo, with sedation caution.  rx sent.  Update me if not better and re: BP in a few days.  Thanks.

## 2019-10-09 NOTE — Telephone Encounter (Signed)
Patient advised.

## 2019-10-12 NOTE — Assessment & Plan Note (Signed)
D/w pt about options.  Would try 7.5mg  amlodipine daily and try home vertigo exercises.  BP cautions d/w pt.   D/w pt about PO fluids.  covid cautions d/w pt.   Vertigo exercises d/w pt.   He'll update [place] about sx and BP in a few days.

## 2019-10-13 ENCOUNTER — Telehealth: Payer: Self-pay | Admitting: *Deleted

## 2019-10-13 NOTE — Telephone Encounter (Signed)
Patient called stating that he is still having problems with his blood pressure. Patient sated that he was advised to increase his Amlodipine 10/09/19. Patient stated that he does not have his readings with him now but Friday, Saturday and Sunday his blood pressure was running around 151/91. Patient stated that he is still having a little dizziness. Patient stated that he thinks based on what he is reading the sun and heat may be effecting his blood pressure medication.  Patient stated that he did not take his blood pressure medication this morning and is only working a half of a day. Patient stated that he works in the heat and sun. Patient stated that he plans on taking his blood pressure later when he gets off of work to see if his medication will work better. . Patient stated that he thought his blood pressure would be better since the increase of his medication last week. Patient stated that he has an appointment scheduled with Dr Alphonsus Sias 10/17/19 for a physical.

## 2019-10-13 NOTE — Telephone Encounter (Signed)
Sounds good

## 2019-10-13 NOTE — Telephone Encounter (Signed)
Left detailed message on voicemail. DPR 

## 2019-10-13 NOTE — Telephone Encounter (Signed)
If the vertigo is some better and if he isn't worse overall, then I would have him continue his baseline meds and keep a log of BP readings in the meantime.  Please bring that to OV with PCP.  Thanks.   Routed to PCP as FYI.

## 2019-10-16 ENCOUNTER — Ambulatory Visit (INDEPENDENT_AMBULATORY_CARE_PROVIDER_SITE_OTHER): Payer: No Typology Code available for payment source | Admitting: Internal Medicine

## 2019-10-16 ENCOUNTER — Encounter: Payer: Self-pay | Admitting: Internal Medicine

## 2019-10-16 ENCOUNTER — Other Ambulatory Visit: Payer: Self-pay

## 2019-10-16 VITALS — BP 118/82 | HR 74 | Temp 97.7°F | Ht 69.0 in | Wt 225.0 lb

## 2019-10-16 DIAGNOSIS — I10 Essential (primary) hypertension: Secondary | ICD-10-CM

## 2019-10-16 DIAGNOSIS — Z0000 Encounter for general adult medical examination without abnormal findings: Secondary | ICD-10-CM | POA: Diagnosis not present

## 2019-10-16 DIAGNOSIS — K219 Gastro-esophageal reflux disease without esophagitis: Secondary | ICD-10-CM | POA: Diagnosis not present

## 2019-10-16 MED ORDER — SILDENAFIL CITRATE 20 MG PO TABS: 60.0000 mg | ORAL_TABLET | Freq: Every day | ORAL | 11 refills | Status: DC | PRN

## 2019-10-16 NOTE — Patient Instructions (Signed)
Please go back to 5mg  of the amlodipine.

## 2019-10-16 NOTE — Assessment & Plan Note (Signed)
BP Readings from Last 3 Encounters:  10/16/19 118/82  10/06/19 137/80  07/07/19 128/64   Repeat after talking to me 148/80---and his machine was 170/108 I am not sure he was really hypertensive during the last visit---may have had heat exhaustion Will go back to amlodipine 5mg  Recheck labs

## 2019-10-16 NOTE — Progress Notes (Signed)
Subjective:    Patient ID: Gabriel [name], male    DOB: [birth year], 59 y.o.   MRN: [id number]  HPI Here for physical This visit occurred during the SARS-CoV-2 public health emergency.  Safety protocols were in place, including screening questions prior to the visit, additional usage of staff PPE, and extensive cleaning of exam room while observing appropriate contact time as indicated for disinfecting solutions.   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 ALPRAZolam (XANAX) 0.5 MG tablet Take 1 tablet by mouth twice daily as needed 60 tablet 0  . amLODipine (NORVASC) 5 MG tablet Take 2 tablets (10 mg total) by mouth daily.    esomeprazole (NEXIUM) 40 MG capsule Take 1 capsule by mouth once daily 90 capsule 0  . meclizine (ANTIVERT) 12.5 MG tablet Take 1 tablet (12.5 mg total) by mouth 3 (three) times daily as needed for dizziness. 30 tablet 0  . Omega-3 Fatty Acids (FISH OIL) 1000 MG CAPS Take 2 capsules daily.     No current facility-administered medications on file prior to visit.    Allergies  Allergen Reactions  . Codeine Sulfate     REACTION: Itching    Past Medical History:  Diagnosis Date  . Arthritis    left hand  . GERD (gastroesophageal reflux disease)   . History of MRSA infection    after chain saw accident  . Hyperlipemia     Past Surgical History:  Procedure Laterality Date  . COLONOSCOPY  09/05/10    normal    Family History  Problem Relation Age of Onset  . Hypertension Mother   . Asthma Mother   . Anemia Mother   . Obesity Brother   . Hypertension Brother   . Arthritis Maternal Grandmother        rheumatoid  . Arthritis Paternal Grandmother        rheumatoid  . Arthritis Paternal Grandfather        rheumatoid  . Colon cancer Neg Hx     Social History   Socioeconomic History  . Marital status: Married    Spouse name: Not on file  . Number of children: 3  . Years of education: Not on file  . Highest education level: Not on file  Occupational History  . 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 Smoking status: Former Architectural technologist  . Smokeless tobacco: Never Used  . Tobacco comment: quit 12/10  Substance and Sexual Activity  . Alcohol use: Yes    Alcohol/week: 24.0 standard drinks    Types: 24 Cans of beer per week    Comment: occ  . Drug use: No  . Sexual activity: Not on file  Other Topics Concern  . Not on file  Social History Narrative   Left handed   Lives with wife and three kids one story home   Social Determinants of Health  Financial Resource Strain:   . Difficulty of Paying Living Expenses: Not on file  Food Insecurity:   . Worried About Programme researcher, broadcasting/film/video in the Last Year: Not on file  . Ran Out of Food in the Last Year: Not on file  Transportation Needs:   . Lack of Transportation (Medical): Not on file  . Lack of Transportation (Non-Medical): Not on file  Physical Activity:   . Days of Exercise per Week: Not on file  . Minutes of Exercise per Session: Not on file  Stress:   . Feeling of Stress : Not on file  Social Connections:   . Frequency of Communication with Friends and Family: Not on file  . Frequency of Social Gatherings with Friends and Family: Not on file  . Attends Religious Services: Not on file  . Active Member of Clubs or Organizations: Not on file  . Attends Banker Meetings:  Not on file  . Marital Status: Not on file  Intimate Partner Violence:   . Fear of Current or Ex-Partner: Not on file  . Emotionally Abused: Not on file  . Physically Abused: Not on file  . Sexually Abused: Not on file   Review of Systems  Constitutional: Negative for unexpected weight change.       Mild fatigue Wears seat belt  HENT: Negative for hearing loss, tinnitus and trouble swallowing.        Overdue for dentist  Eyes: Negative for visual disturbance.       No diplopia or unilateral vision loss  Respiratory: Negative for cough, chest tightness and shortness of breath.   Cardiovascular: Negative for chest pain and palpitations.       Mild foot swelling---?from medication  Gastrointestinal: Negative for abdominal pain, blood in stool and constipation.       No heartburn on nexium  Endocrine: Negative for polydipsia and polyuria.  Genitourinary: Negative for difficulty urinating and urgency.       Some ED---the med helps  Musculoskeletal: Negative for back pain and joint swelling.       Had foot pain--better now with inserts from [employer] [facility]  Skin: Negative for rash.  Allergic/Immunologic: Positive for environmental allergies. Negative for immunocompromised state.       Mild symptoms --no meds  Neurological: Positive for dizziness. Negative for syncope and headaches.  Hematological: Negative for adenopathy. Does not bruise/bleed easily.  Psychiatric/Behavioral: The patient is nervous/anxious.        Occasional sleep problems---1/2 xanax will help Lots of stress--no regular depression       Objective:   Physical Exam Constitutional:      Appearance: Normal appearance.  HENT:     Head: Normocephalic and atraumatic.     Right Ear: Tympanic membrane, ear canal and external ear normal.     Left Ear: Tympanic membrane, ear canal and external ear normal.     Mouth/Throat:     Pharynx: No oropharyngeal exudate or posterior oropharyngeal erythema.  Eyes:      Conjunctiva/sclera: Conjunctivae normal.     Pupils: Pupils are equal, round, and reactive to light.  Cardiovascular:     Rate and Rhythm: Normal rate and regular rhythm.     Pulses: Normal pulses.     Heart sounds: No murmur heard.  No gallop.   Pulmonary:     Effort: Pulmonary effort is normal.     Breath sounds: Normal breath sounds. No wheezing or rales.  Abdominal:     Palpations: Abdomen  is soft.     Tenderness: There is no abdominal tenderness.  Musculoskeletal:     Cervical back: Neck supple.     Right lower leg: No edema.     Left lower leg: No edema.  Lymphadenopathy:     Cervical: No cervical adenopathy.  Skin:    General: Skin is warm.     Findings: No rash.  Neurological:     General: No focal deficit present.     Mental Status: He is alert and oriented to person, place, and time.  Psychiatric:        Mood and Affect: Mood normal.        Behavior: Behavior normal.            Assessment & Plan:

## 2019-10-16 NOTE — Assessment & Plan Note (Signed)
Generally healthy Colon due 2022 Defer PSA to next year Recommended flu vaccine Did get COVID vaccine

## 2019-10-16 NOTE — Assessment & Plan Note (Signed)
Quiet on the PPI 

## 2019-10-17 LAB — T4, FREE: Free T4: 0.65 ng/dL (ref 0.60–1.60)

## 2019-10-17 LAB — COMPREHENSIVE METABOLIC PANEL
ALT: 19 U/L (ref 0–53)
AST: 16 U/L (ref 0–37)
Albumin: 4.7 g/dL (ref 3.5–5.2)
Alkaline Phosphatase: 67 U/L (ref 39–117)
BUN: 19 mg/dL (ref 6–23)
CO2: 26 mEq/L (ref 19–32)
Calcium: 9.9 mg/dL (ref 8.4–10.5)
Chloride: 103 mEq/L (ref 96–112)
Creatinine, Ser: 1.4 mg/dL (ref 0.40–1.50)
GFR: 51.76 mL/min — ABNORMAL LOW (ref >60.00)
Glucose, Bld: 87 mg/dL (ref 70–99)
Potassium: 4.7 mEq/L (ref 3.5–5.1)
Sodium: 140 mEq/L (ref 135–145)
Total Bilirubin: 0.6 mg/dL (ref 0.2–1.2)
Total Protein: 7.3 g/dL (ref 6.0–8.3)

## 2019-10-17 LAB — LIPID PANEL
Cholesterol: 268 mg/dL — ABNORMAL HIGH (ref 0–200)
HDL: 32.7 mg/dL — ABNORMAL LOW (ref >39.00)
NonHDL: 235.54
Total CHOL/HDL Ratio: 8
Triglycerides: 277 mg/dL — ABNORMAL HIGH (ref 0.0–149.0)
VLDL: 55.4 mg/dL — ABNORMAL HIGH (ref 0.0–40.0)

## 2019-10-17 LAB — CBC
HCT: 40.5 % (ref 39.0–52.0)
Hemoglobin: 14 g/dL (ref 13.0–17.0)
MCHC: 34.5 g/dL (ref 30.0–36.0)
MCV: 91.9 fl (ref 78.0–100.0)
Platelets: 263 10*3/uL (ref 150.0–400.0)
RBC: 4.4 Mil/uL (ref 4.22–5.81)
RDW: 13 % (ref 11.5–15.5)
WBC: 9.7 10*3/uL (ref 4.0–10.5)

## 2019-10-17 LAB — LDL CHOLESTEROL, DIRECT: Direct LDL: 182 mg/dL

## 2019-12-22 ENCOUNTER — Telehealth: Payer: Self-pay | Admitting: *Deleted

## 2019-12-22 NOTE — Telephone Encounter (Signed)
I left a detailed message on patient's voice mail with the times Dr.Letvak suggested and asked him to call back.

## 2019-12-22 NOTE — Telephone Encounter (Signed)
I think the next step would be to come back in here and reevaluate. When I saw him for the physical---the episode had seemed to be heat related--though could be related to BP meds. Now I am not sure if it is a heart, neurologic or balance issue Try to get him in tomorrow (can add on at end) or Wednesday (if opening or at end). Can add on Thursday 1:30 if needed also

## 2019-12-22 NOTE — Telephone Encounter (Signed)
Patient scheduled appointment on 12/23/19 at 12:30.

## 2019-12-22 NOTE — Telephone Encounter (Signed)
Patient called stating that he is still having dizzy spells. Patient stated that the dizzy spells have been going on for months off and on. Patient stated that he did a visit with Dr. Para March about this and then had his physical with Dr. Alphonsus Sias. Patient stated that the dizzy spells have now been almost every day for two weeks now. Patient stated that he feels dizzy when he gets out of bed almost every morning. 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 Patient stated that he is wondering if he needs a stress test ordered. Patient denies any cough or SOB. Patient stated that he is taking his medications as prescribed and is wondering if some of his medication may be causing his symptoms. Patient stated that he is so tired after he finishes work he does not feel like doing anything else. Patient stated that the bottom of his feet feel numb and he has gone to the [employer] and has special inserts in his shoes. Patient stated that he know something is not right and wants to know what the next step would be to try and figure this out. Patient was given ER precautions and he verbalized understanding.

## 2019-12-22 NOTE — Telephone Encounter (Signed)
Okay 

## 2019-12-23 ENCOUNTER — Other Ambulatory Visit: Payer: Self-pay

## 2019-12-23 ENCOUNTER — Encounter: Payer: Self-pay | Admitting: Internal Medicine

## 2019-12-23 ENCOUNTER — Ambulatory Visit (INDEPENDENT_AMBULATORY_CARE_PROVIDER_SITE_OTHER): Payer: No Typology Code available for payment source | Admitting: Internal Medicine

## 2019-12-23 VITALS — BP 124/78 | HR 73 | Ht 69.0 in | Wt 230.0 lb

## 2019-12-23 DIAGNOSIS — H8190 Unspecified disorder of vestibular function, unspecified ear: Secondary | ICD-10-CM | POA: Diagnosis not present

## 2019-12-23 DIAGNOSIS — H814 Vertigo of central origin: Secondary | ICD-10-CM | POA: Diagnosis not present

## 2019-12-23 DIAGNOSIS — R200 Anesthesia of skin: Secondary | ICD-10-CM | POA: Diagnosis not present

## 2019-12-23 LAB — CBC
HCT: 40.3 % (ref 39.0–52.0)
Hemoglobin: 13.7 g/dL (ref 13.0–17.0)
MCHC: 34.1 g/dL (ref 30.0–36.0)
MCV: 91 fl (ref 78.0–100.0)
Platelets: 231 10*3/uL (ref 150.0–400.0)
RBC: 4.43 Mil/uL (ref 4.22–5.81)
RDW: 13.3 % (ref 11.5–15.5)
WBC: 7.2 10*3/uL (ref 4.0–10.5)

## 2019-12-23 LAB — HEPATIC FUNCTION PANEL
ALT: 20 U/L (ref 0–53)
AST: 16 U/L (ref 0–37)
Albumin: 4.3 g/dL (ref 3.5–5.2)
Alkaline Phosphatase: 71 U/L (ref 39–117)
Bilirubin, Direct: 0 mg/dL (ref 0.0–0.3)
Total Bilirubin: 0.3 mg/dL (ref 0.2–1.2)
Total Protein: 7 g/dL (ref 6.0–8.3)

## 2019-12-23 LAB — VITAMIN B12: Vitamin B-12: 310 pg/mL (ref 211–911)

## 2019-12-23 LAB — RENAL FUNCTION PANEL
Albumin: 4.3 g/dL (ref 3.5–5.2)
BUN: 14 mg/dL (ref 6–23)
CO2: 27 mEq/L (ref 19–32)
Calcium: 8.9 mg/dL (ref 8.4–10.5)
Chloride: 104 mEq/L (ref 96–112)
Creatinine, Ser: 1.13 mg/dL (ref 0.40–1.50)
GFR: 71.01 mL/min (ref >60.00)
Glucose, Bld: 87 mg/dL (ref 70–99)
Phosphorus: 3.6 mg/dL (ref 2.3–4.6)
Potassium: 3.9 mEq/L (ref 3.5–5.1)
Sodium: 138 mEq/L (ref 135–145)

## 2019-12-23 LAB — T4, FREE: Free T4: 0.71 ng/dL (ref 0.60–1.60)

## 2019-12-23 NOTE — Progress Notes (Signed)
Subjective:    Patient ID: Gabriel [name], male    DOB: Jun 09, 1960, 59 y.o.   MRN: [id number]  HPI Here due to persistent dizziness This visit occurred during the SARS-CoV-2 public health emergency.  Safety protocols were in place, including screening questions prior to the visit, additional usage of staff PPE, and extensive cleaning of exam room while observing appropriate contact time as indicated for disinfecting solutions.   Had spell this summer and never got quite over it Some swimmy headedness since then but improved  Now worse in past 2 weeks Notices it when he raises up out of bed in the morning Also common when he turns his head and lifts it No problems if just sitting Okay when walking straight  Trouble when he is on ladders and moving No true vertigo but he feels like he is moving when he turns his head  Has some numbness in the bottom of his feet Got some inserts at [employer]---helps a little  Current Outpatient Medications on File Prior to Visit  Medication Sig Dispense Refill  . ALPRAZolam (XANAX) 0.5 MG tablet Take 1 tablet by mouth twice daily as needed 60 tablet 0  . amLODipine (NORVASC) 5 MG tablet Take 2 tablets (10 mg total) by mouth daily.    Marland Kitchen esomeprazole (NEXIUM) 40 MG capsule Take 1 capsule by mouth once daily 90 capsule 0  . Omega-3 Fatty Acids (FISH OIL) 1000 MG CAPS Take 2 capsules daily.    . sildenafil (REVATIO) 20 MG tablet Take 3-5 tablets (60-100 mg total) by mouth daily as needed. 50 tablet 11  . meclizine (ANTIVERT) 12.5 MG tablet Take 1 tablet (12.5 mg total) by mouth 3 (three) times daily as needed for dizziness. 30 tablet 0   No current facility-administered medications on file prior to visit.    Allergies  Allergen Reactions  . Codeine Sulfate     REACTION: Itching    Past Medical History:  Diagnosis Date  . Arthritis    left hand  . GERD (gastroesophageal reflux disease)   . History of MRSA infection    after chain saw  accident  . Hyperlipemia     Past Surgical History:  Procedure Laterality Date  . COLONOSCOPY  09/05/10   normal    Family History  Problem Relation Age of Onset  . Hypertension Mother   . Asthma Mother   . Anemia Mother   . Obesity Brother   . Hypertension Brother   . Arthritis Maternal Grandmother        rheumatoid  . Arthritis Paternal Grandmother        rheumatoid  . Arthritis Paternal Grandfather        rheumatoid  . Colon cancer Neg Hx     Social History   Socioeconomic History  . Marital status: Married    Spouse name: Not on file  . Number of children: 3  . Years of education: Not on file  . Highest education level: Not on file  Occupational History  . 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 Smoking status: Former Games developer  . Smokeless tobacco: Never Used  . Tobacco comment: quit 12/10  Substance and Sexual Activity  . Alcohol use: Yes    Alcohol/week: 24.0 standard drinks    Types: 24 Cans of beer per week    Comment: occ  . Drug use: No  . Sexual activity: Not on file  Other Topics Concern  . Not on file  Social History Narrative   Left handed   Lives with wife and three kids one story home   Social Determinants of Health   Financial Resource Strain:   . Difficulty of Paying Living Expenses: Not on file  Food Insecurity:   . Worried About Programme researcher, broadcasting/film/video in the Last Year: Not on file  . Ran Out of Food in the Last Year: Not on file  Transportation Needs:   . Lack of Transportation (Medical): Not on file  . Lack of Transportation (Non-Medical): Not on file  Physical Activity:   . Days of Exercise per Week: Not on file  . Minutes of Exercise per Session: Not on file  Stress:   . Feeling of Stress : Not on file  Social Connections:   . Frequency of Communication with Friends and Family: Not on file  . Frequency of Social Gatherings with Friends and Family: Not on file  . Attends Religious Services:  Not on file  . Active Member of Clubs or Organizations: Not on file  . Attends Banker Meetings: Not on file  . Marital Status: Not on file  Intimate Partner Violence:   . Fear of Current or Ex-Partner: Not on file  . Emotionally Abused: Not on file  . Physically Abused: Not on file  . Sexually Abused: Not on file   Review of Systems Some popping of ears if he swallows. No tinnitus No change in hearing No headaches but will occasionally get sharp pain in various spots in head (come and go quickly) He will notice some left chest pain--but only if using it a lot (like scraping off textured ceilings and he is left handed) No change in fitness level--like climbing ladders Will occasionally exercise--not consistently though Feels his energy is down---"sluggish"    Objective:   Physical Exam Constitutional:      Appearance: Normal appearance.  Eyes:     Comments: Slight horizontal nystagmus on lateral gaze--seems to be both directions  Cardiovascular:     Rate and Rhythm: Normal rate and regular rhythm.     Pulses: Normal pulses.     Heart sounds: No murmur heard.  No gallop.   Pulmonary:     Effort: Pulmonary effort is normal.     Breath sounds: Normal breath sounds. No wheezing or rales.  Abdominal:     Palpations: Abdomen is soft.     Tenderness: There is no abdominal tenderness.  Musculoskeletal:     Cervical back: Neck supple.     Right lower leg: No edema.     Left lower leg: No edema.  Lymphadenopathy:     Cervical: No cervical adenopathy.  Skin:    Findings: No rash.  Neurological:     Mental Status: He is alert.     Cranial Nerves: No cranial nerve deficit.     Coordination: Coordination normal.     Gait: Gait normal.     Comments: Normal gait Romberg absent No weakness Slightly decreased sensation in plantar feet  Psychiatric:        Behavior: Behavior normal.     Comments: Anxious about what is going on            Assessment & Plan:

## 2019-12-23 NOTE — Assessment & Plan Note (Signed)
Occurs with movement but no true vertigo Does have some slight nystagmus Will check MRI to be sure no tumor Neurology evaluation as well Info on home Epley maneuver given

## 2019-12-23 NOTE — Assessment & Plan Note (Signed)
Worsening on plantar feet Will check labs

## 2019-12-25 LAB — PROTEIN ELECTROPHORESIS, SERUM, WITH REFLEX
Albumin ELP: 4.3 g/dL (ref 3.8–4.8)
Alpha 1: 0.3 g/dL (ref 0.2–0.3)
Alpha 2: 0.6 g/dL (ref 0.5–0.9)
Beta 2: 0.3 g/dL (ref 0.2–0.5)
Beta Globulin: 0.3 g/dL — ABNORMAL LOW (ref 0.4–0.6)
Gamma Globulin: 0.9 g/dL (ref 0.8–1.7)
Total Protein: 6.8 g/dL (ref 6.1–8.1)

## 2020-01-14 ENCOUNTER — Other Ambulatory Visit: Payer: Self-pay | Admitting: Internal Medicine

## 2020-01-16 ENCOUNTER — Ambulatory Visit: Payer: No Typology Code available for payment source

## 2020-02-12 ENCOUNTER — Other Ambulatory Visit: Payer: Self-pay | Admitting: Internal Medicine

## 2020-02-12 DIAGNOSIS — G479 Sleep disorder, unspecified: Secondary | ICD-10-CM

## 2020-02-12 NOTE — Telephone Encounter (Signed)
Last filled 09-29-19 #60 Last OV 12-23-19 No Future OV [facility] [address]

## 2020-03-24 ENCOUNTER — Emergency Department: Payer: No Typology Code available for payment source

## 2020-03-24 ENCOUNTER — Emergency Department
Admission: EM | Admit: 2020-03-24 | Discharge: 2020-03-24 | Disposition: A | Discharge status: Home / Self Care | Payer: No Typology Code available for payment source | Attending: Emergency Medicine | Admitting: Emergency Medicine

## 2020-03-24 ENCOUNTER — Telehealth: Payer: Self-pay

## 2020-03-24 ENCOUNTER — Other Ambulatory Visit: Payer: Self-pay

## 2020-03-24 DIAGNOSIS — Z87891 Personal history of nicotine dependence: Secondary | ICD-10-CM | POA: Insufficient documentation

## 2020-03-24 DIAGNOSIS — R531 Weakness: Secondary | ICD-10-CM

## 2020-03-24 DIAGNOSIS — E860 Dehydration: Secondary | ICD-10-CM | POA: Diagnosis not present

## 2020-03-24 DIAGNOSIS — R42 Dizziness and giddiness: Secondary | ICD-10-CM | POA: Diagnosis present

## 2020-03-24 DIAGNOSIS — N289 Disorder of kidney and ureter, unspecified: Secondary | ICD-10-CM | POA: Insufficient documentation

## 2020-03-24 DIAGNOSIS — Z79899 Other long term (current) drug therapy: Secondary | ICD-10-CM | POA: Insufficient documentation

## 2020-03-24 DIAGNOSIS — I10 Essential (primary) hypertension: Secondary | ICD-10-CM | POA: Insufficient documentation

## 2020-03-24 HISTORY — DX: Essential (primary) hypertension: I10

## 2020-03-24 LAB — CBC
HCT: 41.3 % (ref 39.0–52.0)
Hemoglobin: 14 g/dL (ref 13.0–17.0)
MCH: 31.3 pg (ref 26.0–34.0)
MCHC: 33.9 g/dL (ref 30.0–36.0)
MCV: 92.2 fL (ref 80.0–100.0)
Platelets: 263 10*3/uL (ref 150–400)
RBC: 4.48 MIL/uL (ref 4.22–5.81)
RDW: 12.7 % (ref 11.5–15.5)
WBC: 7.7 10*3/uL (ref 4.0–10.5)
nRBC: 0 % (ref 0.0–0.2)

## 2020-03-24 LAB — BASIC METABOLIC PANEL
Anion gap: 10 (ref 5–15)
BUN: 20 mg/dL (ref 6–20)
CO2: 26 mmol/L (ref 22–32)
Calcium: 9.4 mg/dL (ref 8.9–10.3)
Chloride: 104 mmol/L (ref 98–111)
Creatinine, Ser: 1.57 mg/dL — ABNORMAL HIGH (ref 0.61–1.24)
GFR, Estimated: 50 mL/min — ABNORMAL LOW (ref >60)
Glucose, Bld: 114 mg/dL — ABNORMAL HIGH (ref 70–99)
Potassium: 3.9 mmol/L (ref 3.5–5.1)
Sodium: 140 mmol/L (ref 135–145)

## 2020-03-24 LAB — TROPONIN I (HIGH SENSITIVITY): Troponin I (High Sensitivity): 2 ng/L

## 2020-03-24 LAB — TSH: TSH: 3.407 u[IU]/mL (ref 0.350–4.500)

## 2020-03-24 MED ORDER — SODIUM CHLORIDE 0.9 % IV BOLUS
1000.0000 mL | Freq: Once | INTRAVENOUS | Status: AC
  Administered 2020-03-24: 1000 mL via INTRAVENOUS

## 2020-03-24 NOTE — Telephone Encounter (Signed)
Per chart review tab pt is presently at [facility] ED. Sending note to DR Alphonsus Sias who is out of office and Dr Milinda Antis who is in office.

## 2020-03-24 NOTE — ED Triage Notes (Signed)
Pt to ED POV for chief complaint of dizziness, numbness in feet and fingertips bilaterally and cp for 1 years, worsening, sent from pcp  States cp only while active  Denies pain at this time, endorses dizziness  Denies cardiac hx   Pt alert and oriented, clear speech, NAD noted

## 2020-03-24 NOTE — ED Provider Notes (Signed)
[facility] Emergency Department Provider Note  Time seen: 4:51 PM  I have reviewed the triage vital signs and the nursing notes.   HISTORY  Chief Complaint Chest Pain   HPI Gabriel [name] is a 60 y.o. male with a past medical history of gastric reflux, hypertension, hyperlipidemia, presents to the emergency department for multiple symptoms.  According to the patient for the past year or so he has been experiencing worsening neuropathy in his feet.  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  States he has been getting very dizzy over the past several weeks or months especially with exertion.  Patient denies any fever cough shortness of breath.  No headaches no weakness or numbness.  Patient states he was seen his doctor last month and they were unable to find a reason for his symptoms.  Patient states he is only working 4 or 6 hours at a time before he is getting very fatigued.  Patient was concerned so he came to the emergency department for evaluation.  Denies any chest pain at any point.  No abdominal pain.   Past Medical History:  Diagnosis Date  . Arthritis    left hand  . GERD (gastroesophageal reflux disease)   . History of MRSA infection    after chain saw accident  . Hyperlipemia   . Hypertension     Patient Active Problem List   Diagnosis Date Noted  . Vestibular dizziness 12/23/2019  . Temporomandibular joint (TMJ) pain 07/07/2019  . Dizziness 06/10/2019  . Sensory loss 09/03/2018  . Left knee pain 02/19/2017  . Essential hypertension, benign 12/11/2014  . Routine general medical examination at a health care facility 07/05/2010  . Sleep disturbance 03/27/2007  . Hyperlipemia 04/23/2006  . GERD 04/23/2006    Past Surgical History:  Procedure Laterality Date  . COLONOSCOPY  09/05/10   normal    Prior to Admission medications   Medication Sig Start Date End Date Taking? Authorizing Provider   ALPRAZolam Prudy Feeler) 0.5 MG tablet Take 1 tablet by mouth twice daily as needed 02/12/20   Tillman Abide I, MD  amLODipine (NORVASC) 5 MG tablet Take 2 tablets (10 mg total) by mouth daily. 10/09/19   Joaquim Nam, MD  esomeprazole (NEXIUM) 40 MG capsule Take 1 capsule by mouth once daily 01/14/20   Karie Schwalbe, MD  meclizine (ANTIVERT) 12.5 MG tablet Take 1 tablet (12.5 mg total) by mouth 3 (three) times daily as needed for dizziness. 10/09/19   Joaquim Nam, MD  Omega-3 Fatty Acids (FISH OIL) 1000 MG CAPS Take 2 capsules daily.    [provider]  sildenafil (REVATIO) 20 MG tablet Take 3-5 tablets (60-100 mg total) by mouth daily as needed. 10/16/19   Karie Schwalbe, MD    Allergies  Allergen Reactions  . Codeine Sulfate     REACTION: Itching    Family History  Problem Relation Age of Onset  . Hypertension Mother   . Asthma Mother   . Anemia Mother   . Obesity Brother   . Hypertension Brother   . Arthritis Maternal Grandmother        rheumatoid  . Arthritis Paternal Grandmother        rheumatoid  . Arthritis Paternal Grandfather        rheumatoid  . Colon cancer Neg Hx     Social History Social History   Tobacco Use  . Smoking status: Former  Smoker  . Smokeless tobacco: Never Used  . Tobacco comment: quit 12/10  Substance Use Topics  . Alcohol use: Yes    Alcohol/week: 24.0 standard drinks    Types: 24 Cans of beer per week    Comment: occ  . Drug use: No    Review of Systems Constitutional: Negative for fever. Cardiovascular: Negative for chest pain. Respiratory: Negative for shortness of breath. Gastrointestinal: Negative for abdominal pain, vomiting and diarrhea. Genitourinary: Negative for urinary compaints Musculoskeletal: Negative for musculoskeletal complaints Skin: Negative for skin complaints  Neurological: Negative for headache.  Intermittent dizziness/lightheadedness.  No focal weakness or numbness. All other ROS  negative  ____________________________________________   PHYSICAL EXAM:  VITAL SIGNS: ED Triage Vitals  Enc Vitals Group     BP 03/24/20 1505 137/86     Pulse Rate 03/24/20 1505 89     Resp 03/24/20 1505 18     Temp 03/24/20 1505 98.1 F (36.7 C)     Temp Source 03/24/20 1505 Oral     SpO2 03/24/20 1505 97 %     Weight 03/24/20 1501 230 lb (104.3 kg)     Height 03/24/20 1501 5\' 9"  (1.753 m)     Head Circumference --      Peak Flow --      Pain Score 03/24/20 1501 0     Pain Loc --      Pain Edu? --      Excl. in GC? --    Constitutional: Alert and oriented. Well appearing and in no distress. Eyes: Normal exam ENT      Head: Normocephalic and atraumatic.      Mouth/Throat: Mucous membranes are moist. Cardiovascular: Normal rate, regular rhythm Respiratory: Normal respiratory effort without tachypnea nor retractions. Breath sounds are clear  Gastrointestinal: Soft and nontender. No distention.  Musculoskeletal: Nontender with normal range of motion in all extremities.  Neurologic:  Normal speech and language. No gross focal neurologic deficits  Skin:  Skin is warm, dry and intact.  Psychiatric: Mood and affect are normal.   ____________________________________________    EKG  EKG viewed and interpreted by myself shows a normal sinus rhythm 85 bpm with a narrow QRS, normal axis, normal intervals, no concerning ST changes.  ____________________________________________    RADIOLOGY  X-rays negative  ____________________________________________   INITIAL IMPRESSION / ASSESSMENT AND PLAN / ED COURSE  Pertinent labs & imaging results that were available during my care of the patient were reviewed by me and considered in my medical decision making (see chart for details).   Patient presents emergency department for symptoms of dizziness/weakness, neuropathy, fatigue.  States his symptoms have been ongoing for some time weeks or months.  Denies any fever cough  shortness of breath.  I did recommend Covid testing but the patient is declining at this time.  Patient's work-up is overall reassuring including EKG chest x-ray and labs including cardiac enzymes, besides mild renal insufficiency.  I have added on a TSH as a precaution.  We will IV hydrate.  I discussed with the patient increasing oral hydration at home and having his labs rechecked in 1 or 2 weeks by his doctor.  Patient agreeable to plan of care.  Patient feels well after fluids.  I discussed with the patient significant hydration over the next several days and follow-up with his doctor for recheck of his lab work.  Patient troponin is negative.  TSH is normal.  Patient agreeable to plan of care.  05/22/20  was evaluated in Emergency Department on 03/24/2020 for the symptoms described in the history of present illness. He was evaluated in the context of the global COVID-19 pandemic, which necessitated consideration that the patient might be at risk for infection with the SARS-CoV-2 virus that causes COVID-19. Institutional protocols and algorithms that pertain to the evaluation of patients at risk for COVID-19 are in a state of rapid change based on information released by regulatory bodies including the CDC and federal and state organizations. These policies and algorithms were followed during the patient's care in the ED.  ____________________________________________   FINAL CLINICAL IMPRESSION(S) / ED DIAGNOSES  Renal insufficiency Dehydration Fatigue   Minna Antis, MD 03/24/20 [phone]

## 2020-03-24 NOTE — Telephone Encounter (Signed)
[facility] Day - Client TELEPHONE ADVICE RECORD AccessNurse Patient Name: MADOX CORKINS Gender: Male DOB: 15-Jan-1961 Age: 60 Y 12 D Return Phone Number: [phone] (Primary) Address[place] [address] Client [facility] Day - Client Client Site [facility] - Day Physician Tillman Abide- MD Contact Type Call Who Is Calling Patient / Member / Family / Caregiver Call Type Triage / Clinical Relationship To Patient Self Return Phone Number [phone] (Primary) Chief Complaint CHEST PAIN (>=21 years) - pain, pressure, heaviness or tightness Reason for Call Symptomatic / Request for Health Information Initial Comment Caller states he has been experiencing numbness in his finger tips and the balls of his feet. Caller states he is also experiencing elevated heart rate when active and lightheadedness. Caller states he also has experiencig dull chest pains when he moves his left arm. GOTO Facility Not Listed [facility] ER, [place] Translation No Nurse Assessment Nurse: Annye English, RN, Angelique Blonder Date/Time (Eastern Time): 03/24/2020 2:35:45 PM Confirm and document reason for call. If symptomatic, describe symptoms. ---Caller states he has been experiencing numbness in his finger tips and the balls of his feet. Caller states he is also experiencing elevated heart rate when active and lightheadedness. Caller states he also has experiencig dull chest pains when he moves his left arm. Does the patient have any new or worsening symptoms? ---Yes Will a triage be completed? ---Yes Related visit to physician within the last 2 weeks? ---No Does the PT have any chronic conditions? (i.e. diabetes, asthma, this includes High risk factors for pregnancy, etc.) ---No Is this a behavioral health or substance abuse call? ---No Guidelines Guideline Title Affirmed Question Affirmed Notes  Nurse Date/Time (Eastern Time) Chest Pain [1] Chest pain (or "angina") comes and goes AND [2] is happening more often (increasing in frequency) or getting worse (increasing in severity) (Exception: Carmon, RN, Angelique Blonder 03/24/2020 2:36:03 PM PLEASE NOTE: All timestamps contained within this report are represented as [place] Standard Time. CONFIDENTIALTY NOTICE: This fax transmission is intended only for the addressee. It contains information that is legally privileged, confidential or otherwise protected from use or disclosure. If you are not the intended recipient, you are strictly prohibited from reviewing, disclosing, copying using or disseminating any of this information or taking any action in reliance on or regarding this information. If you have received this fax in error, please notify us immediately by telephone so that we can arrange for its return to [place]. Phone: [phone], Toll-Free: [phone], Fax: [phone] Page: 2 of 2 Call Id: [id number] Guidelines Guideline Title Affirmed Question Affirmed Notes Nurse Date/Time Lamount Cohen Time) chest pains that last only a few seconds) Disp. Time Lamount Cohen Time) Disposition Final User 03/24/2020 2:26:02 PM Send to Urgent Mammie Russian 03/24/2020 2:39:40 PM Go to ED Now Yes Carmon, RN, Leighton Ruff Disagree/Comply Comply Caller Understands Yes PreDisposition Call Doctor Care Advice Given Per Guideline GO TO ED NOW: ANOTHER ADULT SHOULD DRIVE: * It is better and safer if another adult drives instead of you. BRING MEDICINES: * Bring a list of your current medicines when you go to the Emergency Department (ER). CALL EMS IF: * Severe difficulty breathing occurs * You become worse CARE ADVICE given per Chest Pain (Adult) guideline. Referrals GO TO FACILITY OTHER - SPECIFY

## 2020-03-24 NOTE — Telephone Encounter (Signed)
Aware she is currently in the ER. Pending correspondence, will cc pcp

## 2020-03-25 NOTE — Telephone Encounter (Signed)
He was seen in the ER and given some IV fluids and sent home. Please call to schedule follow up in next 1-2 weeks so we can decide what the next steps are for his ongoing symptoms

## 2020-03-25 NOTE — Telephone Encounter (Signed)
Patient scheduled appointment on 04/02/20.

## 2020-03-26 ENCOUNTER — Ambulatory Visit: Payer: No Typology Code available for payment source | Admitting: Family Medicine

## 2020-04-02 ENCOUNTER — Other Ambulatory Visit: Payer: Self-pay

## 2020-04-02 ENCOUNTER — Ambulatory Visit (INDEPENDENT_AMBULATORY_CARE_PROVIDER_SITE_OTHER): Payer: No Typology Code available for payment source | Admitting: Internal Medicine

## 2020-04-02 ENCOUNTER — Encounter: Payer: Self-pay | Admitting: Internal Medicine

## 2020-04-02 VITALS — BP 128/78 | HR 71 | Temp 97.8°F | Ht 69.0 in | Wt 237.0 lb

## 2020-04-02 DIAGNOSIS — R0789 Other chest pain: Secondary | ICD-10-CM

## 2020-04-02 DIAGNOSIS — I10 Essential (primary) hypertension: Secondary | ICD-10-CM

## 2020-04-02 DIAGNOSIS — G629 Polyneuropathy, unspecified: Secondary | ICD-10-CM | POA: Diagnosis not present

## 2020-04-02 DIAGNOSIS — K219 Gastro-esophageal reflux disease without esophagitis: Secondary | ICD-10-CM

## 2020-04-02 LAB — RENAL FUNCTION PANEL
Albumin: 4.3 g/dL (ref 3.5–5.2)
BUN: 17 mg/dL (ref 6–23)
CO2: 28 mEq/L (ref 19–32)
Calcium: 9.6 mg/dL (ref 8.4–10.5)
Chloride: 102 mEq/L (ref 96–112)
Creatinine, Ser: 1.11 mg/dL (ref 0.40–1.50)
GFR: 72.41 mL/min (ref >60.00)
Glucose, Bld: 97 mg/dL (ref 70–99)
Phosphorus: 4.3 mg/dL (ref 2.3–4.6)
Potassium: 4.1 mEq/L (ref 3.5–5.1)
Sodium: 136 mEq/L (ref 135–145)

## 2020-04-02 NOTE — Assessment & Plan Note (Signed)
The chest symptoms could be esophageal--but no other symptoms on the nexium

## 2020-04-02 NOTE — Assessment & Plan Note (Signed)
Has had spells of exertional sensation--"like an air bubble" substernal Could be esophageal--but he has sense of something not being right---will set up with cardiology

## 2020-04-02 NOTE — Progress Notes (Signed)
Subjective:    Patient ID: Gabriel [name], male    DOB: [birth year], 60 y.o.   MRN: [id number]  HPI Here for ER follow up This visit occurred during the SARS-CoV-2 public health emergency.  Safety protocols were in place, including screening questions prior to the visit, additional usage of staff PPE, and extensive cleaning of exam room while observing appropriate contact time as indicated for disinfecting solutions.   Reviewed ER records "I've just been feeling bad" Neuropathy in his feet---now taking the gabapentin Having dizziness--feels fatigued and "tension" 2 episodes---"like a big air bubble"---points substernal. First episode 4-5 years ago. 2nd time was recently (about a month ago) Did have sense of chest pain when working hard Also had dizziness Had negative troponin  Creatinine up--- got IV Feels less dizziness now since getting the IV and increasing his fluids  Saw Dr Melodye Ped on gabapentin-- 300/600 now This has helped his sensation since increasing the dose (due to increase to 600 bid)  No heartburn or dysphagia on the nexium  Current Outpatient Medications on File Prior to Visit  Medication Sig Dispense Refill  . ALPRAZolam (XANAX) 0.5 MG tablet Take 1 tablet by mouth twice daily as needed 60 tablet 0  . amLODipine (NORVASC) 5 MG tablet Take 2 tablets (10 mg total) by mouth daily. (Patient taking differently: Take 5 mg by mouth daily.)    . esomeprazole (NEXIUM) 40 MG capsule Take 1 capsule by mouth once daily 90 capsule 3  . gabapentin (NEURONTIN) 300 MG capsule Take 2 capsules by mouth 2 (two) times daily.    . Omega-3 Fatty Acids (FISH OIL) 1000 MG CAPS Take 2 capsules daily.    . sildenafil (REVATIO) 20 MG tablet Take 3-5 tablets (60-100 mg total) by mouth daily as needed. 50 tablet 11   No current facility-administered medications on file prior to visit.    Allergies  Allergen Reactions  . Codeine Sulfate     REACTION: Itching    Past Medical  History:  Diagnosis Date  . Arthritis    left hand  . GERD (gastroesophageal reflux disease)   . History of MRSA infection    after chain saw accident  . Hyperlipemia   . Hypertension     Past Surgical History:  Procedure Laterality Date  . COLONOSCOPY  09/05/10   normal    Family History  Problem Relation Age of Onset  . Hypertension Mother   . Asthma Mother   . Anemia Mother   . Obesity Brother   . Hypertension Brother   . Arthritis Maternal Grandmother        rheumatoid  . Arthritis Paternal Grandmother        rheumatoid  . Arthritis Paternal Grandfather        rheumatoid  . Colon cancer Neg Hx     Social History   Socioeconomic History  . Marital status: Married    Spouse name: Not on file  . Number of children: 3  . Years of education: Not on file  . Highest education level: Not on file  Occupational History  . 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 Smoking status: Former Games developer  . Smokeless tobacco: Never Used  . Tobacco comment: quit 12/10  Substance and Sexual Activity  . Alcohol use: Yes    Alcohol/week: 24.0 standard drinks    Types: 24 Cans of beer per week    Comment: occ  .  Drug use: No  . Sexual activity: Not on file  Other Topics Concern  . Not on file  Social History Narrative   Left handed   Lives with wife and three kids one story home   Social Determinants of Health   Financial Resource Strain: Not on file  Food Insecurity: Not on file  Transportation Needs: Not on file  Physical Activity: Not on file  Stress: Not on file  Social Connections: Not on file  Intimate Partner Violence: Not on file   Review of Systems Sleeping okay Has had "a lot on me in the past couple of months"----wife lost job, son having troubles Appetite is okay Weight is up 10#---less active due to neuropathy in feet    Objective:   Physical Exam Constitutional:      Appearance: Normal appearance.   Cardiovascular:     Rate and Rhythm: Normal rate and regular rhythm.     Heart sounds: No murmur heard. No gallop.   Pulmonary:     Effort: Pulmonary effort is normal.     Breath sounds: Normal breath sounds. No wheezing or rales.  Abdominal:     Palpations: Abdomen is soft.     Tenderness: There is no abdominal tenderness.  Musculoskeletal:     Cervical back: Neck supple.     Right lower leg: No edema.     Left lower leg: No edema.  Lymphadenopathy:     Cervical: No cervical adenopathy.  Neurological:     General: No focal deficit present.     Mental Status: He is alert.  Psychiatric:        Mood and Affect: Mood normal.        Behavior: Behavior normal.            Assessment & Plan:

## 2020-04-02 NOTE — Assessment & Plan Note (Signed)
BP Readings from Last 3 Encounters:  04/02/20 128/78  03/24/20 140/89  12/23/19 124/78   Good control on amlodipine

## 2020-04-02 NOTE — Assessment & Plan Note (Signed)
Seen by Dr Sherryll Burger Some help with the gabapentin Will check serum protein electrophoresis also

## 2020-04-05 LAB — PROTEIN ELECTROPHORESIS, SERUM, WITH REFLEX
Albumin ELP: 4.5 g/dL (ref 3.8–4.8)
Alpha 1: 0.3 g/dL (ref 0.2–0.3)
Alpha 2: 0.6 g/dL (ref 0.5–0.9)
Beta 2: 0.3 g/dL (ref 0.2–0.5)
Beta Globulin: 0.4 g/dL (ref 0.4–0.6)
Gamma Globulin: 0.9 g/dL (ref 0.8–1.7)
Total Protein: 7 g/dL (ref 6.1–8.1)

## 2020-06-09 ENCOUNTER — Other Ambulatory Visit: Payer: Self-pay | Admitting: Internal Medicine

## 2020-07-07 ENCOUNTER — Other Ambulatory Visit: Payer: Self-pay | Admitting: Internal Medicine

## 2020-07-07 DIAGNOSIS — G479 Sleep disorder, unspecified: Secondary | ICD-10-CM

## 2020-07-07 NOTE — Telephone Encounter (Signed)
Last OV - 04/02/2020 Next OV - 11/01/2020 Last Filled - 09/29/2019  [facility] [address]

## 2020-10-24 ENCOUNTER — Other Ambulatory Visit: Payer: Self-pay | Admitting: Internal Medicine

## 2020-10-24 DIAGNOSIS — G479 Sleep disorder, unspecified: Secondary | ICD-10-CM

## 2020-10-25 NOTE — Telephone Encounter (Signed)
Last filled 07-08-20 #60 Last OV 04-02-20 Next OV 11-01-20 [facility] [address]

## 2020-11-01 ENCOUNTER — Ambulatory Visit (INDEPENDENT_AMBULATORY_CARE_PROVIDER_SITE_OTHER): Payer: No Typology Code available for payment source | Admitting: Internal Medicine

## 2020-11-01 ENCOUNTER — Encounter: Payer: Self-pay | Admitting: Internal Medicine

## 2020-11-01 ENCOUNTER — Other Ambulatory Visit: Payer: Self-pay

## 2020-11-01 VITALS — BP 140/88 | HR 82 | Temp 97.6°F | Ht 69.0 in | Wt 231.0 lb

## 2020-11-01 DIAGNOSIS — Z125 Encounter for screening for malignant neoplasm of prostate: Secondary | ICD-10-CM

## 2020-11-01 DIAGNOSIS — K219 Gastro-esophageal reflux disease without esophagitis: Secondary | ICD-10-CM | POA: Diagnosis not present

## 2020-11-01 DIAGNOSIS — G629 Polyneuropathy, unspecified: Secondary | ICD-10-CM | POA: Diagnosis not present

## 2020-11-01 DIAGNOSIS — I10 Essential (primary) hypertension: Secondary | ICD-10-CM

## 2020-11-01 DIAGNOSIS — Z0000 Encounter for general adult medical examination without abnormal findings: Secondary | ICD-10-CM

## 2020-11-01 MED ORDER — ESOMEPRAZOLE MAGNESIUM 40 MG PO CPDR: 40.0000 mg | DELAYED_RELEASE_CAPSULE | Freq: Every day | ORAL | 3 refills | Status: DC

## 2020-11-01 MED ORDER — SILDENAFIL CITRATE 20 MG PO TABS: 60.0000 mg | ORAL_TABLET | Freq: Every day | ORAL | 11 refills | Status: AC | PRN

## 2020-11-01 NOTE — Assessment & Plan Note (Signed)
Not really painful Given the fatigue---I will have him try to reduce or stop the gabapentin in AM

## 2020-11-01 NOTE — Assessment & Plan Note (Signed)
Healthy but needs to work on fitness, eating better Recommended flu and bivalent COVID vaccines--he is considering Due for colonoscopy now---contacted Dr Leone Payor about setting this up Will check PSA

## 2020-11-01 NOTE — Assessment & Plan Note (Signed)
Quiet on the nexium 

## 2020-11-01 NOTE — Assessment & Plan Note (Signed)
Okay on amlodipine 

## 2020-11-01 NOTE — Progress Notes (Signed)
Subjective:    Patient ID: Gabriel [name], male    DOB: [birth year], 60 y.o.   MRN: [id number]  HPI Here for physical This visit occurred during the SARS-CoV-2 public health emergency.  Safety protocols were in place, including screening questions prior to the visit, additional usage of staff PPE, and extensive cleaning of exam room while observing appropriate contact time as indicated for disinfecting solutions.   Having some trouble with lack of energy  Still with trouble with his feet--neuropathy On the gabapentin still Numbness and tingling---balance is off Not sleepy after morning dose Legs seem to be better in the AM if he takes xanax at bedtime  Chest pain has not recurred Felt better after hydration in ER No dizziness lately  Current Outpatient Medications on File Prior to Visit  Medication Sig Dispense Refill   ALPRAZolam (XANAX) 0.5 MG tablet Take 1 tablet by mouth twice daily as needed 60 tablet 0   amLODipine (NORVASC) 5 MG tablet TAKE 1 TABLET BY MOUTH EVERY DAY 90 tablet 1   esomeprazole (NEXIUM) 40 MG capsule Take 1 capsule by mouth once daily 90 capsule 3   gabapentin (NEURONTIN) 300 MG capsule Take 2 capsules by mouth 2 (two) times daily.     Omega-3 Fatty Acids (FISH OIL) 1000 MG CAPS Take 2 capsules daily.     sildenafil (REVATIO) 20 MG tablet Take 3-5 tablets (60-100 mg total) by mouth daily as needed. 50 tablet 11   No current facility-administered medications on file prior to visit.    Allergies  Allergen Reactions   Codeine Sulfate     REACTION: Itching    Past Medical History:  Diagnosis Date   Arthritis    left hand   GERD (gastroesophageal reflux disease)    History of MRSA infection    after chain saw accident   Hyperlipemia    Hypertension     Past Surgical History:  Procedure Laterality Date   COLONOSCOPY  09/05/10   normal    Family History  Problem Relation Age of Onset   Hypertension Mother    Asthma Mother    Anemia Mother     Obesity Brother    Hypertension Brother    Arthritis Maternal Grandmother        rheumatoid   Arthritis Paternal Grandmother        rheumatoid   Arthritis Paternal Grandfather        rheumatoid   Colon cancer Neg Hx     Social History   Socioeconomic History   Marital status: Married    Spouse name: Not on file   Number of children: 3   Years of education: Not on file   Highest education level: Not on file  Occupational History   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    Comment:    Tobacco Use   Smoking status: Former   Smokeless tobacco: Never   Tobacco comments:    quit 12/10  Substance and Sexual Activity   Alcohol use: Yes    Alcohol/week: 24.0 standard drinks    Types: 24 Cans of beer per week    Comment: occ   Drug use: No   Sexual activity: Not on file  Other Topics Concern   Not on file  Social History Narrative   Left handed   Lives with wife and three kids one story home   Social Determinants of Health   Financial Resource Strain: Not on file  Food Insecurity: Not on  file  Transportation Needs: Not on file  Physical Activity: Not on file  Stress: Not on file  Social Connections: Not on file  Intimate Partner Violence: Not on file   Review of Systems  Constitutional:  Positive for fatigue. Negative for unexpected weight change.       Wears seat belt  HENT:  Negative for dental problem, hearing loss, tinnitus and trouble swallowing.        Keeps up with dentist  Eyes:  Negative for visual disturbance.       No diplopia or unilateral vision loss---due for exam  Respiratory:  Negative for cough, chest tightness and shortness of breath.   Cardiovascular:  Negative for chest pain, palpitations and leg swelling.  Gastrointestinal:  Negative for blood in stool and constipation.       No heartburn on nexium  Endocrine: Negative for polydipsia and polyuria.  Genitourinary:  Negative for difficulty urinating and urgency.       Satisfied  with sildenafil  Musculoskeletal:  Negative for arthralgias, back pain and joint swelling.  Skin:  Negative for rash.       No suspicious lesions  Allergic/Immunologic: Negative for immunocompromised state.       Some mild symptoms mowing the lawn  Neurological:  Negative for dizziness, light-headedness and headaches.  Hematological:  Negative for adenopathy. Does not bruise/bleed easily.  Psychiatric/Behavioral:  Negative for dysphoric mood and sleep disturbance. The patient is not nervous/anxious.        Typical "ups and down"      Objective:   Physical Exam Constitutional:      Appearance: Normal appearance.  HENT:     Right Ear: Tympanic membrane and ear canal normal.     Left Ear: Tympanic membrane and ear canal normal.     Mouth/Throat:     Pharynx: No oropharyngeal exudate or posterior oropharyngeal erythema.  Eyes:     Conjunctiva/sclera: Conjunctivae normal.     Pupils: Pupils are equal, round, and reactive to light.  Cardiovascular:     Rate and Rhythm: Normal rate and regular rhythm.     Pulses: Normal pulses.     Heart sounds: No murmur heard.   No gallop.  Pulmonary:     Effort: Pulmonary effort is normal.     Breath sounds: Normal breath sounds. No wheezing or rales.  Abdominal:     Palpations: Abdomen is soft.     Tenderness: There is no abdominal tenderness.  Musculoskeletal:     Cervical back: Neck supple.     Right lower leg: No edema.     Left lower leg: No edema.  Lymphadenopathy:     Cervical: No cervical adenopathy.  Skin:    General: Skin is warm.     Findings: No rash.  Neurological:     General: No focal deficit present.     Mental Status: He is alert and oriented to person, place, and time.  Psychiatric:        Mood and Affect: Mood normal.        Behavior: Behavior normal.           Assessment & Plan:

## 2020-11-01 NOTE — Patient Instructions (Signed)
Please cut down on the gabapentin to 300mg  in the morning (just 1 capsule). If the neuropathy is not worse, you can stop the morning dose completely. I hope this will help your fatigue.

## 2020-11-02 LAB — VITAMIN B12: Vitamin B-12: 769 pg/mL (ref 211–911)

## 2020-11-02 LAB — COMPREHENSIVE METABOLIC PANEL
ALT: 14 U/L (ref 0–53)
AST: 14 U/L (ref 0–37)
Albumin: 4.3 g/dL (ref 3.5–5.2)
Alkaline Phosphatase: 73 U/L (ref 39–117)
BUN: 14 mg/dL (ref 6–23)
CO2: 24 mEq/L (ref 19–32)
Calcium: 9.4 mg/dL (ref 8.4–10.5)
Chloride: 103 mEq/L (ref 96–112)
Creatinine, Ser: 1.45 mg/dL (ref 0.40–1.50)
GFR: 52.33 mL/min — ABNORMAL LOW (ref >60.00)
Glucose, Bld: 79 mg/dL (ref 70–99)
Potassium: 4 mEq/L (ref 3.5–5.1)
Sodium: 137 mEq/L (ref 135–145)
Total Bilirubin: 0.4 mg/dL (ref 0.2–1.2)
Total Protein: 7.1 g/dL (ref 6.0–8.3)

## 2020-11-02 LAB — CBC
HCT: 40.2 % (ref 39.0–52.0)
Hemoglobin: 13.7 g/dL (ref 13.0–17.0)
MCHC: 34.1 g/dL (ref 30.0–36.0)
MCV: 94.4 fl (ref 78.0–100.0)
Platelets: 225 10*3/uL (ref 150.0–400.0)
RBC: 4.26 Mil/uL (ref 4.22–5.81)
RDW: 12.9 % (ref 11.5–15.5)
WBC: 6.3 10*3/uL (ref 4.0–10.5)

## 2020-11-02 LAB — T4, FREE: Free T4: 0.64 ng/dL (ref 0.60–1.60)

## 2020-11-02 LAB — PSA: PSA: 0.45 ng/mL (ref 0.10–4.00)

## 2020-12-09 ENCOUNTER — Other Ambulatory Visit: Payer: Self-pay | Admitting: Internal Medicine

## 2020-12-19 ENCOUNTER — Encounter: Payer: Self-pay | Admitting: Internal Medicine

## 2021-01-21 ENCOUNTER — Other Ambulatory Visit: Payer: Self-pay | Admitting: Internal Medicine

## 2021-01-21 DIAGNOSIS — G479 Sleep disorder, unspecified: Secondary | ICD-10-CM

## 2021-01-21 NOTE — Telephone Encounter (Signed)
Last filled 10-26-20 #60 Last OV 11-01-20 Next OV 11-03-21 [facility] [address]

## 2021-05-09 ENCOUNTER — Other Ambulatory Visit: Payer: Self-pay | Admitting: Internal Medicine

## 2021-05-09 DIAGNOSIS — G479 Sleep disorder, unspecified: Secondary | ICD-10-CM

## 2021-05-09 NOTE — Telephone Encounter (Signed)
Last filled 01-21-21 #60 ?Last OV 11-01-20 ?Next OV 11-03-21 ?[facility] [address] ?

## 2021-07-27 ENCOUNTER — Encounter: Payer: Self-pay | Admitting: Internal Medicine

## 2021-07-27 ENCOUNTER — Ambulatory Visit (INDEPENDENT_AMBULATORY_CARE_PROVIDER_SITE_OTHER): Payer: No Typology Code available for payment source | Admitting: Internal Medicine

## 2021-07-27 DIAGNOSIS — G629 Polyneuropathy, unspecified: Secondary | ICD-10-CM

## 2021-07-27 NOTE — Assessment & Plan Note (Signed)
More sensory than pain--but mixed Clearly side effects from the gabapentin Didn't think he tolerated alpha lipoic acid  Discussed changing the gabapentin--- to 600/600/600 to start Then try weaning down during the day to see if that helps his sluggish feeling Consider retrying the alpha lipoic acid

## 2021-07-27 NOTE — Patient Instructions (Signed)
Try the [facility] shoes   Change the gabapentin to 600mg  morning, afternoon and night----and then cut the daytime doses if you don't notice a change in leg pain. (Like 300, 300, 600). You can increase the nighttime dose if needed (to help sleep). It would be good to even stop the daytime doses.

## 2021-07-27 NOTE — Progress Notes (Signed)
Subjective:    Patient ID: Gabriel [name], male    DOB: 09-01-1960, 61 y.o.   MRN: [id number]  HPI Here due to leg pain  Frustrated with the neuropathy Bottom of feet and toes are numb----variable based on how much he is walking on them Wondering about leg and feet alignment Having pain in feet/legs and even hips  Has adjusted his gabapentin---now taking 900mg  AM and lunch Wonders if this is causing generalized stiffness he now notices Sluggish all day as well  Tried cymbalta---didn't tolerate it  Some shooting pain in toes at night Some degree of pain during the day---he feels the gabapentin helps him get through the working day  Current Outpatient Medications on File Prior to Visit  Medication Sig Dispense Refill   ALPRAZolam (XANAX) 0.5 MG tablet Take 1 tablet by mouth twice daily as needed 60 tablet 0   amLODipine (NORVASC) 5 MG tablet TAKE 1 TABLET BY MOUTH EVERY DAY 90 tablet 3   esomeprazole (NEXIUM) 40 MG capsule Take 1 capsule (40 mg total) by mouth daily. 90 capsule 3   gabapentin (NEURONTIN) 300 MG capsule Take by mouth. 3 caps in am 3 at lunch     Omega-3 Fatty Acids (FISH OIL) 1000 MG CAPS Take 2 capsules daily.     sildenafil (REVATIO) 20 MG tablet Take 3-5 tablets (60-100 mg total) by mouth daily as needed. 50 tablet 11   No current facility-administered medications on file prior to visit.    Allergies  Allergen Reactions   Codeine Sulfate     REACTION: Itching    Past Medical History:  Diagnosis Date   Arthritis    left hand   GERD (gastroesophageal reflux disease)    History of MRSA infection    after chain saw accident   Hyperlipemia    Hypertension     Past Surgical History:  Procedure Laterality Date   COLONOSCOPY  09/05/10   normal    Family History  Problem Relation Age of Onset   Hypertension Mother    Asthma Mother    Anemia Mother    Obesity Brother    Hypertension Brother    Arthritis Maternal Grandmother        rheumatoid    Arthritis Paternal Grandmother        rheumatoid   Arthritis Paternal Grandfather        rheumatoid   Colon cancer Neg Hx     Social History   Socioeconomic History   Marital status: Married    Spouse name: Not on file   Number of children: 3   Years of education: Not on file   Highest education level: Not on file  Occupational History   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    Comment:    Tobacco Use   Smoking status: Former    Passive exposure: Past   Smokeless tobacco: Never   Tobacco comments:    quit 12/10  Substance and Sexual Activity   Alcohol use: Yes    Alcohol/week: 24.0 standard drinks of alcohol    Types: 24 Cans of beer per week    Comment: occ   Drug use: No   Sexual activity: Not on file  Other Topics Concern   Not on file  Social History Narrative   Left handed   Lives with wife and three kids one story home   Social Determinants of Health   Financial Resource Strain: Not on file  Food Insecurity: Not on  file  Transportation Needs: Not on file  Physical Activity: Not on file  Stress: Not on file  Social Connections: Not on file  Intimate Partner Violence: Not on file   Review of Systems Usually sleeps okay---but feels "dead tired" during the day at times     Objective:   Physical Exam Constitutional:      Appearance: Normal appearance.  Neurological:     Mental Status: He is alert.     Comments: Mildly antalgic gait Slight genu varum No obvious leg length discrepancy            Assessment & Plan:

## 2021-08-26 ENCOUNTER — Encounter: Payer: Self-pay | Admitting: Internal Medicine

## 2021-09-02 ENCOUNTER — Other Ambulatory Visit: Payer: Self-pay | Admitting: Internal Medicine

## 2021-09-26 ENCOUNTER — Other Ambulatory Visit: Payer: Self-pay | Admitting: Internal Medicine

## 2021-09-26 DIAGNOSIS — G479 Sleep disorder, unspecified: Secondary | ICD-10-CM

## 2021-09-27 NOTE — Telephone Encounter (Signed)
Last filled 05-09-21 #60 Last OV 07-27-21 Next OV 11-03-21 [facility] [address]

## 2021-09-29 ENCOUNTER — Encounter: Payer: Self-pay | Admitting: Internal Medicine

## 2021-10-02 NOTE — Progress Notes (Unsigned)
    Gabriel Loughmiller T. Gabriel Cinco, MD, CAQ Sports Medicine [facility] [address] [place], [address]  Phone: [phone]  FAX: [phone]  Gabriel [name] - 61 y.o. male  MRN [id number]  Date of Birth: 1960/03/29  Date: 10/03/2021  PCP: Gabriel Schwalbe, MD  Referral: Gabriel Schwalbe, MD  No chief complaint on file.  Subjective:   Gabriel [name] is a 61 y.o. very pleasant male patient with There is no height or weight on file to calculate BMI. who presents with the following:  This very pleasant young lady is seen courtesy of my partner Dr. Alphonsus [name] for evaluation of some ongoing right-sided knee pain.    Review of Systems is noted in the HPI, as appropriate   Objective:   There were no vitals taken for this visit.  ***  Radiology: No results found.  Assessment and Plan:   ***

## 2021-10-03 ENCOUNTER — Ambulatory Visit (INDEPENDENT_AMBULATORY_CARE_PROVIDER_SITE_OTHER): Payer: No Typology Code available for payment source | Admitting: Family Medicine

## 2021-10-03 ENCOUNTER — Ambulatory Visit (INDEPENDENT_AMBULATORY_CARE_PROVIDER_SITE_OTHER)
Admission: RE | Admit: 2021-10-03 | Discharge: 2021-10-03 | Disposition: A | Discharge status: Home / Self Care | Payer: No Typology Code available for payment source | Source: Ambulatory Visit | Attending: Family Medicine | Admitting: Family Medicine

## 2021-10-03 ENCOUNTER — Encounter: Payer: Self-pay | Admitting: Family Medicine

## 2021-10-03 VITALS — BP 138/74 | HR 77 | Temp 98.6°F | Ht 70.0 in | Wt 226.0 lb

## 2021-10-03 DIAGNOSIS — M7542 Impingement syndrome of left shoulder: Secondary | ICD-10-CM | POA: Diagnosis not present

## 2021-10-03 DIAGNOSIS — M19042 Primary osteoarthritis, left hand: Secondary | ICD-10-CM

## 2021-10-03 DIAGNOSIS — M19041 Primary osteoarthritis, right hand: Secondary | ICD-10-CM | POA: Diagnosis not present

## 2021-10-03 DIAGNOSIS — M1711 Unilateral primary osteoarthritis, right knee: Secondary | ICD-10-CM | POA: Diagnosis not present

## 2021-10-03 DIAGNOSIS — M25561 Pain in right knee: Secondary | ICD-10-CM

## 2021-10-03 MED ORDER — TRIAMCINOLONE ACETONIDE 40 MG/ML IJ SUSP
40.0000 mg | Freq: Once | INTRAMUSCULAR | Status: AC
  Administered 2021-10-03: 40 mg via INTRAMUSCULAR

## 2021-10-03 MED ORDER — PREDNISONE 20 MG PO TABS: ORAL_TABLET | ORAL | 0 refills | Status: DC

## 2021-11-03 ENCOUNTER — Ambulatory Visit (INDEPENDENT_AMBULATORY_CARE_PROVIDER_SITE_OTHER): Payer: No Typology Code available for payment source | Admitting: Internal Medicine

## 2021-11-03 ENCOUNTER — Encounter: Payer: Self-pay | Admitting: Internal Medicine

## 2021-11-03 VITALS — BP 132/84 | HR 75 | Temp 97.7°F | Ht 69.0 in | Wt 230.0 lb

## 2021-11-03 DIAGNOSIS — G629 Polyneuropathy, unspecified: Secondary | ICD-10-CM

## 2021-11-03 DIAGNOSIS — I10 Essential (primary) hypertension: Secondary | ICD-10-CM | POA: Diagnosis not present

## 2021-11-03 DIAGNOSIS — F39 Unspecified mood [affective] disorder: Secondary | ICD-10-CM | POA: Insufficient documentation

## 2021-11-03 DIAGNOSIS — Z0000 Encounter for general adult medical examination without abnormal findings: Secondary | ICD-10-CM

## 2021-11-03 DIAGNOSIS — Z1211 Encounter for screening for malignant neoplasm of colon: Secondary | ICD-10-CM

## 2021-11-03 DIAGNOSIS — K219 Gastro-esophageal reflux disease without esophagitis: Secondary | ICD-10-CM

## 2021-11-03 NOTE — Assessment & Plan Note (Signed)
Some better Has reduced the gabapentin considerably--fatigue now better

## 2021-11-03 NOTE — Assessment & Plan Note (Signed)
BP Readings from Last 3 Encounters:  11/03/21 132/84  10/03/21 138/74  07/27/21 130/82   Controlled on amlodipine 5mg 

## 2021-11-03 NOTE — Progress Notes (Signed)
Subjective:    Patient ID: Gabriel [name], male    DOB: 12/31/1960, 61 y.o.   MRN: [id number]  HPI Here for physical  Knee and shoulders are better Still stiff but less pain Had prednisone for 10 days  Worried about his kidneys Last GFR 52 Gets some back pain--worried about kidneys (but probably muscular)  Having some trouble with anxiety Wonders about his sugar dropping and feels weak Now thinks it may be anxiety Does worry about things--bills, his business  Uses 1/2 xanax at night--just about every night. This helps the neuropathy  Current Outpatient Medications on File Prior to Visit  Medication Sig Dispense Refill   ALPRAZolam (XANAX) 0.5 MG tablet Take 1 tablet by mouth twice daily as needed 60 tablet 0   amLODipine (NORVASC) 5 MG tablet TAKE 1 TABLET BY MOUTH EVERY DAY 90 tablet 3   cyanocobalamin (VITAMIN B12) 500 MCG tablet Take 500 mcg by mouth daily.     esomeprazole (NEXIUM) 40 MG capsule Take 1 capsule by mouth once daily 90 capsule 0   gabapentin (NEURONTIN) 300 MG capsule Take 300 mg by mouth in the morning. And 300mg  at bedtime as needed     sildenafil (REVATIO) 20 MG tablet Take 3-5 tablets (60-100 mg total) by mouth daily as needed. 50 tablet 11   No current facility-administered medications on file prior to visit.    Allergies  Allergen Reactions   Codeine Sulfate     REACTION: Itching    Past Medical History:  Diagnosis Date   Arthritis    left hand   GERD (gastroesophageal reflux disease)    History of MRSA infection    after chain saw accident   Hyperlipemia    Hypertension     Past Surgical History:  Procedure Laterality Date   COLONOSCOPY  09/05/10   normal    Family History  Problem Relation Age of Onset   Hypertension Mother    Asthma Mother    Anemia Mother    Obesity Brother    Hypertension Brother    Arthritis Maternal Grandmother        rheumatoid   Arthritis Paternal Grandmother        rheumatoid   Arthritis Paternal  Grandfather        rheumatoid   Colon cancer Neg Hx     Social History   Socioeconomic History   Marital status: Married    Spouse name: Not on file   Number of children: 3   Years of education: Not on file   Highest education level: Not on file  Occupational History   Occupation: Engineer, 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  Other Topics Concern   Not on file  Social History Narrative   Left handed   Lives with wife and three kids one story home   Social Determinants of Health   Financial Resource Strain: Not on file  Food Insecurity: Not on file  Transportation Needs: Not on file  Physical Activity: Not on file  Stress: Not on  file  Social Connections: Not on file  Intimate Partner Violence: Not on file   Review of Systems  Constitutional:  Negative for unexpected weight change.       Fatigue is better on lower gabapentin Wears seat belt  HENT:  Negative for dental problem and tinnitus.        Notices his heartbeat in his ears---?hearing loss Overdue for dentist  Eyes:  Negative for visual disturbance.       No diplopia or unilateral vision loss  Respiratory:  Negative for cough, chest tightness and shortness of breath.   Cardiovascular:  Negative for chest pain, palpitations and leg swelling.  Gastrointestinal:  Negative for blood in stool and constipation.       No heartburn on the nexium  Endocrine: Negative for polydipsia and polyuria.  Genitourinary:        Some increased frequency when at home Flow is okay Satisfied with sildenafil  Musculoskeletal:  Positive for arthralgias and back pain. Negative for joint swelling.        Uses ibuprofen as needed  Skin:  Negative for rash.  Allergic/Immunologic: Positive for environmental allergies. Negative for immunocompromised state.       Mild allergies--like after mowing the lawn  Neurological:  Negative for dizziness, syncope, light-headedness and headaches.  Hematological:  Negative for adenopathy. Does not bruise/bleed easily.  Psychiatric/Behavioral:  Negative for dysphoric mood. The patient is nervous/anxious.        Episodic sleep problems       Objective:   Physical Exam Constitutional:      Appearance: Normal appearance.  HENT:     Mouth/Throat:     Pharynx: No oropharyngeal exudate or posterior oropharyngeal erythema.  Eyes:     Conjunctiva/sclera: Conjunctivae normal.     Pupils: Pupils are equal, round, and reactive to light.  Cardiovascular:     Rate and Rhythm: Normal rate and regular rhythm.     Pulses: Normal pulses.     Heart sounds: No murmur heard.    No gallop.  Pulmonary:     Effort: Pulmonary effort is normal.     Breath sounds: Normal breath sounds. No wheezing or rales.  Abdominal:     Palpations: Abdomen is soft.     Tenderness: There is no abdominal tenderness.  Musculoskeletal:     Cervical back: Neck supple.     Right lower leg: No edema.     Left lower leg: No edema.  Lymphadenopathy:     Cervical: No cervical adenopathy.  Skin:    Findings: No lesion or rash.  Neurological:     General: No focal deficit present.     Mental Status: He is alert and oriented to person, place, and time.  Psychiatric:        Mood and Affect: Mood normal.        Behavior: Behavior normal.            Assessment & Plan:

## 2021-11-03 NOTE — Assessment & Plan Note (Signed)
Healthy Discussed fitness Didn't get colonoscopy due to no good insurance--will do FIT Defer PSA to next year Recommended COVID and flu vaccines soon

## 2021-11-03 NOTE — Assessment & Plan Note (Signed)
Some anxiety Uses xanax prn--and it helps with sleep

## 2021-11-03 NOTE — Assessment & Plan Note (Signed)
Quiet on nexium 

## 2021-11-03 NOTE — Assessment & Plan Note (Deleted)
Healthy Discussed fitness Didn't get colonoscopy due to no good insurance--will do FIT Defer PSA to next year Recommended COVID and flu vaccines soon  

## 2021-11-04 LAB — COMPREHENSIVE METABOLIC PANEL
ALT: 14 U/L (ref 0–53)
AST: 13 U/L (ref 0–37)
Albumin: 4 g/dL (ref 3.5–5.2)
Alkaline Phosphatase: 69 U/L (ref 39–117)
BUN: 13 mg/dL (ref 6–23)
CO2: 26 mEq/L (ref 19–32)
Calcium: 9 mg/dL (ref 8.4–10.5)
Chloride: 103 mEq/L (ref 96–112)
Creatinine, Ser: 1.39 mg/dL (ref 0.40–1.50)
GFR: 54.66 mL/min — ABNORMAL LOW (ref >60.00)
Glucose, Bld: 83 mg/dL (ref 70–99)
Potassium: 3.8 mEq/L (ref 3.5–5.1)
Sodium: 139 mEq/L (ref 135–145)
Total Bilirubin: 0.4 mg/dL (ref 0.2–1.2)
Total Protein: 6.6 g/dL (ref 6.0–8.3)

## 2021-11-04 LAB — CBC
HCT: 38.5 % — ABNORMAL LOW (ref 39.0–52.0)
Hemoglobin: 13.4 g/dL (ref 13.0–17.0)
MCHC: 34.7 g/dL (ref 30.0–36.0)
MCV: 93.8 fl (ref 78.0–100.0)
Platelets: 253 10*3/uL (ref 150.0–400.0)
RBC: 4.1 Mil/uL — ABNORMAL LOW (ref 4.22–5.81)
RDW: 13 % (ref 11.5–15.5)
WBC: 6.9 10*3/uL (ref 4.0–10.5)

## 2021-11-09 ENCOUNTER — Telehealth: Payer: Self-pay

## 2021-11-09 NOTE — Telephone Encounter (Signed)
-----   Message from Venia Carbon, MD sent at 11/04/2021  1:25 PM EDT ----- Results released Find out if he would like to see a kidney specialist (optional)

## 2021-11-09 NOTE — Telephone Encounter (Signed)
Left message on VM per DPR for pt to let us know if he wanted to see a kidney specialist and if so, [facility] or [place]?

## 2021-11-17 ENCOUNTER — Other Ambulatory Visit: Payer: Self-pay | Admitting: Internal Medicine

## 2021-11-28 ENCOUNTER — Other Ambulatory Visit: Payer: Self-pay | Admitting: Internal Medicine

## 2021-12-19 ENCOUNTER — Encounter: Payer: Self-pay | Admitting: Family Medicine

## 2021-12-19 ENCOUNTER — Other Ambulatory Visit: Payer: Self-pay | Admitting: Internal Medicine

## 2021-12-19 DIAGNOSIS — G479 Sleep disorder, unspecified: Secondary | ICD-10-CM

## 2021-12-19 MED ORDER — CELECOXIB 200 MG PO CAPS: 200.0000 mg | ORAL_CAPSULE | Freq: Every day | ORAL | 2 refills | Status: DC

## 2021-12-19 NOTE — Telephone Encounter (Signed)
Last filled 09-27-21 #60 Last OV 11-03-21 Next OV 11-06-22 [place]

## 2022-03-29 ENCOUNTER — Other Ambulatory Visit: Payer: Self-pay | Admitting: Internal Medicine

## 2022-03-29 DIAGNOSIS — G479 Sleep disorder, unspecified: Secondary | ICD-10-CM

## 2022-03-29 NOTE — Telephone Encounter (Signed)
Last filled 12-19-21 #60 Last OV 11-03-21 Next OV 11-06-22 [place]

## 2022-04-05 ENCOUNTER — Other Ambulatory Visit: Payer: Self-pay | Admitting: Family Medicine

## 2022-04-05 NOTE — Telephone Encounter (Signed)
Last office visit 11/03/21 with Dr. Silvio Pate for CPE.  Last refilled 12/19/21 for #30 with 2 refills by Dr. Lorelei Pont.  Next Appt: CPE 11/06/22 with PCP.

## 2022-05-08 ENCOUNTER — Encounter: Payer: Self-pay | Admitting: Family Medicine

## 2022-05-08 DIAGNOSIS — M1711 Unilateral primary osteoarthritis, right knee: Secondary | ICD-10-CM

## 2022-05-16 ENCOUNTER — Ambulatory Visit (INDEPENDENT_AMBULATORY_CARE_PROVIDER_SITE_OTHER): Payer: No Typology Code available for payment source | Admitting: Family

## 2022-05-16 ENCOUNTER — Other Ambulatory Visit: Payer: Self-pay

## 2022-05-16 ENCOUNTER — Encounter: Payer: Self-pay | Admitting: Family

## 2022-05-16 DIAGNOSIS — G8929 Other chronic pain: Secondary | ICD-10-CM

## 2022-05-16 DIAGNOSIS — M25561 Pain in right knee: Secondary | ICD-10-CM

## 2022-05-16 NOTE — Progress Notes (Signed)
Office Visit Note   Patient: Gabriel [name]           Date of Birth: 1960-06-15           MRN: [id number] Visit Date: 05/16/2022              Requested by: Owens Loffler, MD [place],  [place] [address] PCP: Venia Carbon, MD  Chief Complaint  Patient presents with   Right Knee - Pain      HPI: The patient is a 62 year old gentleman who is seen today for concern of a nearly 10-year history of right knee pain.  He states the pain has become more constant and used to wax and wane.  He has good days and bad days he feels like he is having increasing weakness in the right quad as well.  Has been having grinding pain medial joint line tenderness occasional giving way.  Pain that wakes him at night if he sleeps with the knee flexed  Last seen for the knee pain in August of last year radiographs were performed at that time.  He did receive a Depo-Medrol injection which provided him with some minimal interval relief.  Assessment & Plan: Visit Diagnoses:  1. Chronic pain of right knee     Plan: Seen together with Dr. Sharol Given.  Depo-Medrol injection of the right knee today.  Will also work on prior authorization for supplemental injection as his last Depo-Medrol injection provided little relief.  He is interested in considering total knee arthroplasty.  Follow-Up Instructions: Return if symptoms worsen or fail to improve.   Right Knee Exam   Tenderness  The patient is experiencing tenderness in the medial joint line.  Range of Motion  The patient has normal right knee ROM.  Tests  Varus: negative Valgus: negative  Other  Erythema: absent Swelling: mild Effusion: effusion present      Patient is alert, oriented, no adenopathy, well-dressed, normal affect, normal respiratory effort.   Imaging: No results found. No images are attached to the encounter.  Labs: Lab Results  Component Value Date   ESRSEDRATE 17 11/19/2008     Lab Results   Component Value Date   ALBUMIN 4.0 11/03/2021   ALBUMIN 4.3 11/01/2020   ALBUMIN 4.3 04/02/2020    No results found for: "MG" No results found for: "VD25OH"  No results found for: "PREALBUMIN"    Latest Ref Rng & Units 11/03/2021    4:17 PM 11/01/2020    4:46 PM 03/24/2020    3:03 PM  CBC EXTENDED  WBC 4.0 - 10.5 K/uL 6.9  6.3  7.7   RBC 4.22 - 5.81 Mil/uL 4.10  4.26  4.48   Hemoglobin 13.0 - 17.0 g/dL 13.4  13.7  14.0   HCT 39.0 - 52.0 % 38.5  40.2  41.3   Platelets 150.0 - 400.0 K/uL 253.0  225.0  263      There is no height or weight on file to calculate BMI.  Orders:  Orders Placed This Encounter  Procedures   XR Knee 1-2 Views Right   No orders of the defined types were placed in this encounter.    Procedures: No procedures performed  Clinical Data: No additional findings.  ROS:  All other systems negative, except as noted in the HPI. Review of Systems  Objective: Vital Signs: There were no vitals taken for this visit.  Specialty Comments:  No specialty comments available.  PMFS History: Patient Active  Problem List   Diagnosis Date Noted   Mood disorder 11/03/2021   Vestibular dizziness 12/23/2019   Temporomandibular joint (TMJ) pain 07/07/2019   Peripheral polyneuropathy 09/03/2018   Left knee pain 02/19/2017   Essential hypertension, benign 12/11/2014   Routine general medical examination at a health care facility 07/05/2010   Sleep disturbance 03/27/2007   Hyperlipemia 04/23/2006   GERD 04/23/2006   Past Medical History:  Diagnosis Date   Arthritis    left hand   GERD (gastroesophageal reflux disease)    History of MRSA infection    after chain saw accident   Hyperlipemia    Hypertension     Family History  Problem Relation Age of Onset   Hypertension Mother    Asthma Mother    Anemia Mother    Obesity Brother    Hypertension Brother    Arthritis Maternal Grandmother        rheumatoid   Arthritis Paternal Grandmother         rheumatoid   Arthritis Paternal Grandfather        rheumatoid   Colon cancer Neg Hx     Past Surgical History:  Procedure Laterality Date   COLONOSCOPY  09/05/10   normal   Social History   Occupational History   Occupation: Engineer, 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

## 2022-05-23 ENCOUNTER — Ambulatory Visit: Payer: No Typology Code available for payment source | Admitting: Orthopaedic Surgery

## 2022-06-06 ENCOUNTER — Other Ambulatory Visit: Payer: Self-pay | Admitting: Internal Medicine

## 2022-06-06 DIAGNOSIS — G479 Sleep disorder, unspecified: Secondary | ICD-10-CM

## 2022-06-06 NOTE — Telephone Encounter (Signed)
Last filled 03-29-22 #60 Last OV 11-03-21 Next OV 11-06-22 [facility] [address]

## 2022-07-13 ENCOUNTER — Other Ambulatory Visit: Payer: Self-pay | Admitting: Internal Medicine

## 2022-07-13 DIAGNOSIS — G479 Sleep disorder, unspecified: Secondary | ICD-10-CM

## 2022-07-13 NOTE — Telephone Encounter (Signed)
Last filled 06-07-22 #60 Last OV 11-03-21 Next OV 11-06-22 [facility] [address]

## 2022-07-16 ENCOUNTER — Other Ambulatory Visit: Payer: Self-pay | Admitting: Family Medicine

## 2022-08-23 ENCOUNTER — Other Ambulatory Visit: Payer: Self-pay | Admitting: Internal Medicine

## 2022-08-23 DIAGNOSIS — G479 Sleep disorder, unspecified: Secondary | ICD-10-CM

## 2022-09-28 ENCOUNTER — Encounter (INDEPENDENT_AMBULATORY_CARE_PROVIDER_SITE_OTHER): Payer: Self-pay

## 2022-10-09 ENCOUNTER — Other Ambulatory Visit: Payer: Self-pay | Admitting: Internal Medicine

## 2022-10-19 ENCOUNTER — Other Ambulatory Visit: Payer: Self-pay | Admitting: Internal Medicine

## 2022-10-19 DIAGNOSIS — G479 Sleep disorder, unspecified: Secondary | ICD-10-CM

## 2022-10-19 NOTE — Telephone Encounter (Signed)
Last filled 08-23-22 #60 Last OV 11-04-22 Next OV 11-06-22 [facility] [address]

## 2022-11-06 ENCOUNTER — Encounter: Payer: Self-pay | Admitting: Internal Medicine

## 2022-11-06 ENCOUNTER — Ambulatory Visit (INDEPENDENT_AMBULATORY_CARE_PROVIDER_SITE_OTHER): Payer: No Typology Code available for payment source | Admitting: Internal Medicine

## 2022-11-06 VITALS — BP 112/70 | HR 71 | Temp 97.6°F | Ht 69.0 in | Wt 226.0 lb

## 2022-11-06 DIAGNOSIS — N1831 Chronic kidney disease, stage 3a: Secondary | ICD-10-CM | POA: Diagnosis not present

## 2022-11-06 DIAGNOSIS — I10 Essential (primary) hypertension: Secondary | ICD-10-CM | POA: Diagnosis not present

## 2022-11-06 DIAGNOSIS — F39 Unspecified mood [affective] disorder: Secondary | ICD-10-CM | POA: Diagnosis not present

## 2022-11-06 DIAGNOSIS — K219 Gastro-esophageal reflux disease without esophagitis: Secondary | ICD-10-CM | POA: Diagnosis not present

## 2022-11-06 DIAGNOSIS — Z1211 Encounter for screening for malignant neoplasm of colon: Secondary | ICD-10-CM

## 2022-11-06 DIAGNOSIS — Z125 Encounter for screening for malignant neoplasm of prostate: Secondary | ICD-10-CM | POA: Diagnosis not present

## 2022-11-06 DIAGNOSIS — Z0000 Encounter for general adult medical examination without abnormal findings: Secondary | ICD-10-CM | POA: Diagnosis not present

## 2022-11-06 DIAGNOSIS — G629 Polyneuropathy, unspecified: Secondary | ICD-10-CM

## 2022-11-06 NOTE — Patient Instructions (Addendum)
For the neuropathy--- try capsiacin cream (will worsen burning at first), TENS machine, lidocaine patch or gel or alpha lipoic acid.  You can try the 3 gabapentin at bedtime, 2 in morning--to see if it helps his energy.

## 2022-11-06 NOTE — Assessment & Plan Note (Signed)
Quiet on nexium

## 2022-11-06 NOTE — Assessment & Plan Note (Signed)
Not able to exercise with his knee--discussed resistance FIT--needs to do it Will check PSA Still prefers no flu, COVID, shingrix vaccines

## 2022-11-06 NOTE — Assessment & Plan Note (Signed)
Mostly stress, occ dysthymia, some anxiety Has the xanax for prn

## 2022-11-06 NOTE — Assessment & Plan Note (Signed)
Stable for 2 years--will recheck Needs to stop advil/aleve Will have to cut back or stop the celebrex depending on labs

## 2022-11-06 NOTE — Assessment & Plan Note (Signed)
Discussed options for Rx Gabapentin 600/900

## 2022-11-06 NOTE — Assessment & Plan Note (Signed)
BP Readings from Last 3 Encounters:  11/06/22 112/70  11/03/21 132/84  10/03/21 138/74   Okay on amlodipine 5mg  daily

## 2022-11-06 NOTE — Progress Notes (Signed)
Subjective:    Patient ID: Gabriel [name], male    DOB: 27-Jan-1961, 62 y.o.   MRN: [id number]  HPI Here for physical  GFR ~50 for the past 2 years Aches and pains---uses celebrex every day Has been to [facility] about his knees---and some shoulder problems also Still limps Also takes aleve/advil at times----urged him to stop  Feels slowed down with neuropathy also Gabapentin not helping that much---takes 900 AM/600PM Uses xanax at night No persistent depression--just sluggish (from gabapentin) Stress with work---self employed and can't find help  Current Outpatient Medications on File Prior to Visit  Medication Sig Dispense Refill   ALPRAZolam (XANAX) 0.5 MG tablet Take 1 tablet by mouth twice daily as needed 60 tablet 0   amLODipine (NORVASC) 5 MG tablet TAKE 1 TABLET BY MOUTH EVERY DAY 90 tablet 3   celecoxib (CELEBREX) 200 MG capsule Take 1 capsule (200 mg total) by mouth daily as needed. 30 capsule 3   cyanocobalamin (VITAMIN B12) 500 MCG tablet Take 500 mcg by mouth daily.     esomeprazole (NEXIUM) 40 MG capsule Take 1 capsule by mouth once daily 90 capsule 3   gabapentin (NEURONTIN) 300 MG capsule Take 300 mg by mouth in the morning. And 300mg  at bedtime as needed     sildenafil (REVATIO) 20 MG tablet Take 3-5 tablets (60-100 mg total) by mouth daily as needed. 50 tablet 11   TURMERIC PO Take by mouth.     No current facility-administered medications on file prior to visit.    Allergies  Allergen Reactions   Codeine Sulfate     REACTION: Itching    Past Medical History:  Diagnosis Date   Arthritis    left hand   GERD (gastroesophageal reflux disease)    History of MRSA infection    after chain saw accident   Hyperlipemia    Hypertension     Past Surgical History:  Procedure Laterality Date   COLONOSCOPY  09/05/10   normal    Family History  Problem Relation Age of Onset   Hypertension Mother    Asthma Mother    Anemia Mother    Obesity Brother     Hypertension Brother    Arthritis Maternal Grandmother        rheumatoid   Arthritis Paternal Grandmother        rheumatoid   Arthritis Paternal Grandfather        rheumatoid   Colon cancer Neg Hx     Social History   Socioeconomic History   Marital status: Married    Spouse name: Not on file   Number of children: 3   Years of education: Not on file   Highest education level: Not on file  Occupational History   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    Comment:    Tobacco Use   Smoking status: Former    Passive exposure: Past   Smokeless tobacco: Never   Tobacco comments:    quit 12/10  Substance and Sexual Activity   Alcohol use: Yes    Alcohol/week: 24.0 standard drinks of alcohol    Types: 24 Cans of beer per week    Comment: occ   Drug use: No   Sexual activity: Not on file  Other Topics Concern   Not on file  Social History Narrative   Left handed   Lives with wife and three kids one story home   Social Determinants of Health   Financial Resource Strain:  Not on file  Food Insecurity: Not on file  Transportation Needs: Not on file  Physical Activity: Not on file  Stress: Not on file  Social Connections: Not on file  Intimate Partner Violence: Not on file   Review of Systems  Constitutional:  Positive for fatigue. Negative for unexpected weight change.       Wears seat belt  HENT:  Negative for dental problem, hearing loss, tinnitus and trouble swallowing.        Overdue for dentist  Eyes:  Negative for visual disturbance.       No diplopia or unilateral vision loss  Respiratory:  Negative for cough, chest tightness and shortness of breath.   Cardiovascular:  Negative for chest pain, palpitations and leg swelling.  Gastrointestinal:  Negative for blood in stool and constipation.       No heartburn with nexium  Endocrine: Negative for polydipsia and polyuria.  Genitourinary:  Negative for difficulty urinating and urgency.       Ongoing  ED--has the sildenafil (hasn't really been using)  Musculoskeletal:  Positive for arthralgias.  Skin:  Negative for rash.  Allergic/Immunologic: Negative for environmental allergies and immunocompromised state.  Neurological:  Negative for dizziness, syncope, light-headedness and headaches.  Hematological:  Negative for adenopathy. Does not bruise/bleed easily.  Psychiatric/Behavioral:  Negative for sleep disturbance. The patient is nervous/anxious.        Variable sleep--occ up in night and trouble getting back to sleep Occ down mood--mostly stress       Objective:   Physical Exam Constitutional:      Appearance: Normal appearance.  HENT:     Mouth/Throat:     Pharynx: No oropharyngeal exudate or posterior oropharyngeal erythema.  Eyes:     Conjunctiva/sclera: Conjunctivae normal.     Pupils: Pupils are equal, round, and reactive to light.  Cardiovascular:     Rate and Rhythm: Normal rate and regular rhythm.     Pulses: Normal pulses.     Heart sounds: No murmur heard.    No gallop.  Pulmonary:     Effort: Pulmonary effort is normal.     Breath sounds: Normal breath sounds. No wheezing or rales.  Abdominal:     Palpations: Abdomen is soft.     Tenderness: There is no abdominal tenderness.  Musculoskeletal:     Cervical back: Neck supple.     Right lower leg: No edema.     Left lower leg: No edema.  Lymphadenopathy:     Cervical: No cervical adenopathy.  Skin:    Findings: No lesion or rash.  Neurological:     General: No focal deficit present.     Mental Status: He is alert and oriented to person, place, and time.  Psychiatric:        Mood and Affect: Mood normal.        Behavior: Behavior normal.            Assessment & Plan:

## 2022-11-07 LAB — CBC
HCT: 40.7 % (ref 39.0–52.0)
Hemoglobin: 13.9 g/dL (ref 13.0–17.0)
MCHC: 34 g/dL (ref 30.0–36.0)
MCV: 96 fl (ref 78.0–100.0)
Platelets: 245 10*3/uL (ref 150.0–400.0)
RBC: 4.24 Mil/uL (ref 4.22–5.81)
RDW: 13.1 % (ref 11.5–15.5)
WBC: 6.9 10*3/uL (ref 4.0–10.5)

## 2022-11-07 LAB — HEPATIC FUNCTION PANEL
ALT: 15 U/L (ref 0–53)
AST: 23 U/L (ref 0–37)
Albumin: 4.3 g/dL (ref 3.5–5.2)
Alkaline Phosphatase: 77 U/L (ref 39–117)
Bilirubin, Direct: 0.1 mg/dL (ref 0.0–0.3)
Total Bilirubin: 0.5 mg/dL (ref 0.2–1.2)
Total Protein: 6.6 g/dL (ref 6.0–8.3)

## 2022-11-07 LAB — LDL CHOLESTEROL, DIRECT: Direct LDL: 148 mg/dL

## 2022-11-07 LAB — RENAL FUNCTION PANEL
Albumin: 4.3 g/dL (ref 3.5–5.2)
BUN: 17 mg/dL (ref 6–23)
CO2: 25 mEq/L (ref 19–32)
Calcium: 9.2 mg/dL (ref 8.4–10.5)
Chloride: 105 mEq/L (ref 96–112)
Creatinine, Ser: 1.35 mg/dL (ref 0.40–1.50)
GFR: 56.21 mL/min — ABNORMAL LOW (ref >60.00)
Glucose, Bld: 91 mg/dL (ref 70–99)
Phosphorus: 3.4 mg/dL (ref 2.3–4.6)
Potassium: 3.8 mEq/L (ref 3.5–5.1)
Sodium: 139 mEq/L (ref 135–145)

## 2022-11-07 LAB — LIPID PANEL
Cholesterol: 251 mg/dL — ABNORMAL HIGH (ref 0–200)
HDL: 33.4 mg/dL — ABNORMAL LOW (ref >39.00)
Total CHOL/HDL Ratio: 8
Triglycerides: 582 mg/dL — ABNORMAL HIGH (ref 0.0–149.0)

## 2022-11-07 LAB — VITAMIN B12: Vitamin B-12: 688 pg/mL (ref 211–911)

## 2022-11-07 LAB — TSH: TSH: 2.86 u[IU]/mL (ref 0.35–5.50)

## 2022-11-07 LAB — PARATHYROID HORMONE, INTACT (NO CA): PTH: 47 pg/mL (ref 16–77)

## 2022-11-07 LAB — VITAMIN D 25 HYDROXY (VIT D DEFICIENCY, FRACTURES): VITD: 30.32 ng/mL (ref 30.00–100.00)

## 2022-11-07 LAB — PSA: PSA: 0.57 ng/mL (ref 0.10–4.00)

## 2022-11-13 ENCOUNTER — Other Ambulatory Visit: Payer: Self-pay

## 2022-11-13 DIAGNOSIS — Z1211 Encounter for screening for malignant neoplasm of colon: Secondary | ICD-10-CM

## 2022-11-14 LAB — FECAL OCCULT BLOOD, IMMUNOCHEMICAL: Fecal Occult Bld: NEGATIVE

## 2022-11-22 ENCOUNTER — Other Ambulatory Visit: Payer: Self-pay | Admitting: Internal Medicine

## 2022-12-04 ENCOUNTER — Encounter: Payer: Self-pay | Admitting: Internal Medicine

## 2022-12-05 ENCOUNTER — Telehealth: Payer: No Typology Code available for payment source | Admitting: Family Medicine

## 2022-12-05 ENCOUNTER — Encounter: Payer: Self-pay | Admitting: Family Medicine

## 2022-12-05 VITALS — Temp 97.7°F | Ht 69.0 in | Wt 226.0 lb

## 2022-12-05 DIAGNOSIS — U071 COVID-19: Secondary | ICD-10-CM | POA: Insufficient documentation

## 2022-12-05 MED ORDER — NIRMATRELVIR/RITONAVIR (PAXLOVID) TABLET (RENAL DOSING): ORAL_TABLET | ORAL | 0 refills | Status: DC

## 2022-12-05 NOTE — Progress Notes (Signed)
VIRTUAL VISIT A virtual visit is felt to be most appropriate for this patient at this time.   I connected with the patient on 12/05/22 at 12:00 PM EDT by virtual telehealth platform and verified that I am speaking with the correct person using two identifiers.   I discussed the limitations, risks, security and privacy concerns of performing an evaluation and management service by  virtual telehealth platform and the availability of in person appointments. I also discussed with the patient that there may be a patient responsible charge related to this service. The patient expressed understanding and agreed to proceed.  Patient location: Home Provider Location: [place] Participants: [name] and [name]   Chief Complaint  Patient presents with   Covid Positive    Tested positive yesterday at 5:30 pm   Cough   Headache   Nasal Congestion    History of Present Illness:  62 y.o. male patient of Gabriel Schwalbe, MD presents with  COVID 19   Date of onset: 24 hours Initial symptoms included headache, neck stiffness, body aches and nasal congestion Symptoms progressed to  dry cough, diarrhea, no SOB, no wheeze.  Chest congestion. No fever. Cough not keeping him up at night.   Sick contacts: none COVID testing:  October 21    He has tried to treat with  mucinex q 12 hours , tylneol    He s drinking lots of water.   No history of chronic lung disease such as asthma or COPD. Has history of stage IIIa chronic kidney disease GFR 56, hypertension and BMI 33 Non-smoker.    COVID 19 screen No recent travel or known exposure to COVID19 The patient denies respiratory symptoms of COVID 19 at this time.  The importance of social distancing was discussed today.   Review of Systems  Constitutional:  Negative for chills and fever.  HENT:  Positive for congestion and sore throat. Negative for ear pain and sinus pain.   Eyes:  Negative for pain and redness.   Respiratory:  Positive for cough. Negative for shortness of breath.   Cardiovascular:  Negative for chest pain, palpitations and leg swelling.  Gastrointestinal:  Negative for abdominal pain, blood in stool, constipation, diarrhea, nausea and vomiting.  Genitourinary:  Negative for dysuria.  Musculoskeletal:  Negative for falls and myalgias.  Skin:  Negative for rash.  Neurological:  Positive for dizziness and headaches.  Psychiatric/Behavioral:  Negative for depression. The patient is not nervous/anxious.       Past Medical History:  Diagnosis Date   Arthritis    left hand   GERD (gastroesophageal reflux disease)    History of MRSA infection    after chain saw accident   Hyperlipemia    Hypertension     reports that he has quit smoking. He has been exposed to tobacco smoke. He has never used smokeless tobacco. He reports current alcohol use of about 24.0 standard drinks of alcohol per week. He reports that he does not use drugs.   Current Outpatient Medications:    ALPRAZolam (XANAX) 0.5 MG tablet, Take 1 tablet by mouth twice daily as needed, Disp: 60 tablet, Rfl: 0   amLODipine (NORVASC) 5 MG tablet, TAKE 1 TABLET BY MOUTH EVERY DAY, Disp: 90 tablet, Rfl: 2   celecoxib (CELEBREX) 200 MG capsule, Take 1 capsule (200 mg total) by mouth daily as needed., Disp: 30 capsule, Rfl: 3   cyanocobalamin (VITAMIN B12) 500 MCG tablet, Take 500 mcg by mouth  daily., Disp: , Rfl:    esomeprazole (NEXIUM) 40 MG capsule, Take 1 capsule by mouth once daily, Disp: 90 capsule, Rfl: 3   gabapentin (NEURONTIN) 300 MG capsule, Take 600 mg by mouth in the morning. And 900 mg at bedtime, Disp: , Rfl:    nirmatrelvir/ritonavir, renal dosing, (PAXLOVID) 10 x 150 MG & 10 x 100MG  TABS, (Take nirmatrelvir 150 mg one tablet twice daily for 5 days and ritonavir 100 mg one tablet twice daily for 5 days) Patient GFR is 56, Disp: 20 tablet, Rfl: 0   sildenafil (REVATIO) 20 MG tablet, Take 3-5 tablets (60-100 mg  total) by mouth daily as needed., Disp: 50 tablet, Rfl: 11   TURMERIC PO, Take by mouth., Disp: , Rfl:    Observations/Objective: Temperature 97.7 F (36.5 C), temperature source Oral, height 5\' 9"  (1.753 m), weight 226 lb (102.5 kg).  Physical Exam Constitutional:      General: The patient is not in acute distress. Pulmonary:     Effort: Pulmonary effort is normal. No respiratory distress.  Neurological:     Mental Status: The patient is alert and oriented to person, place, and time.  Psychiatric:        Mood and Affect: Mood normal.        Behavior: Behavior normal.    Assessment and Plan COVID-19 Assessment & Plan: COVID19  Infection < 5 days from onset of symptoms in  vaccinated overweight individual with history of obesity.  No clear sign of bacterial infection at this time.   No SOB.  No red flags/need for ER visit or in-person exam at respiratory clinic at this time..    Pt higher risk for COVID complications given  age and weight.Marland Kitchen GFR  56 and no medication contraindications (except will hold sildenafil).  Start paxlovid 5 day course, renale dosing . Reviewed course of medication and side effect profile with patient in detail.   Symptomatic care with mucinex and cough suppressant at night. If SOB begins symptoms worsening.. have low threshold for in-person exam, if severe shortness of breath ER visit recommended.  Can monitor Oxygen saturation at home with home monitor if able to obtain.  Go to ER if O2 sat < 90% on room air.   Reviewed home care and provided information.  Recommended 5 days isolation, Return to work day 6 with a mask to complete 10 days. Provided info about prevention of spread of COVID 19.    Other orders -     nirmatrelvir/ritonavir (renal dosing); (Take nirmatrelvir 150 mg one tablet twice daily for 5 days and ritonavir 100 mg one tablet twice daily for 5 days) Patient GFR is 56  Dispense: 20 tablet; Refill: 0      I discussed the  assessment and treatment plan with the patient. The patient was provided an opportunity to ask questions and all were answered. The patient agreed with the plan and demonstrated an understanding of the instructions.   The patient was advised to call back or seek an in-person evaluation if the symptoms worsen or if the condition fails to improve as anticipated.     [name], MD

## 2022-12-05 NOTE — Assessment & Plan Note (Signed)
COVID19  Infection < 5 days from onset of symptoms in  vaccinated overweight individual with history of obesity.  No clear sign of bacterial infection at this time.   No SOB.  No red flags/need for ER visit or in-person exam at respiratory clinic at this time..    Pt higher risk for COVID complications given  age and weight.Marland Kitchen GFR  56 and no medication contraindications (except will hold sildenafil).  Start paxlovid 5 day course, renale dosing . Reviewed course of medication and side effect profile with patient in detail.   Symptomatic care with mucinex and cough suppressant at night. If SOB begins symptoms worsening.. have low threshold for in-person exam, if severe shortness of breath ER visit recommended.  Can monitor Oxygen saturation at home with home monitor if able to obtain.  Go to ER if O2 sat < 90% on room air.   Reviewed home care and provided information.  Recommended 5 days isolation, Return to work day 6 with a mask to complete 10 days. Provided info about prevention of spread of COVID 19.

## 2022-12-05 NOTE — Telephone Encounter (Signed)
Called and spoke to pt. Made him a virtual visit with Dr Ermalene Searing today.

## 2022-12-14 ENCOUNTER — Encounter: Payer: Self-pay | Admitting: Internal Medicine

## 2022-12-14 ENCOUNTER — Other Ambulatory Visit: Payer: Self-pay | Admitting: Internal Medicine

## 2022-12-14 DIAGNOSIS — G479 Sleep disorder, unspecified: Secondary | ICD-10-CM

## 2022-12-14 NOTE — Telephone Encounter (Signed)
Last filled 10-19-22 #60 Last OV 11-06-22 Next OV 11-07-23 [facility] [address]

## 2022-12-17 ENCOUNTER — Other Ambulatory Visit: Payer: Self-pay | Admitting: Internal Medicine

## 2023-02-08 ENCOUNTER — Other Ambulatory Visit: Payer: Self-pay | Admitting: Internal Medicine

## 2023-02-08 DIAGNOSIS — G479 Sleep disorder, unspecified: Secondary | ICD-10-CM

## 2023-02-08 NOTE — Telephone Encounter (Signed)
Last filled 12-14-22 #60 Last OV Acute 12-05-22 Next OV 11-07-23 [facility] [address]

## 2023-03-04 ENCOUNTER — Other Ambulatory Visit: Payer: Self-pay | Admitting: Internal Medicine

## 2023-03-18 ENCOUNTER — Encounter: Payer: Self-pay | Admitting: Internal Medicine

## 2023-03-27 ENCOUNTER — Encounter: Payer: Self-pay | Admitting: Internal Medicine

## 2023-03-27 ENCOUNTER — Ambulatory Visit (INDEPENDENT_AMBULATORY_CARE_PROVIDER_SITE_OTHER): Payer: Self-pay | Admitting: Internal Medicine

## 2023-03-27 ENCOUNTER — Other Ambulatory Visit: Payer: Self-pay | Admitting: Internal Medicine

## 2023-03-27 VITALS — BP 142/86 | HR 75 | Temp 98.2°F | Ht 69.0 in | Wt 231.0 lb

## 2023-03-27 DIAGNOSIS — I10 Essential (primary) hypertension: Secondary | ICD-10-CM

## 2023-03-27 DIAGNOSIS — G629 Polyneuropathy, unspecified: Secondary | ICD-10-CM

## 2023-03-27 DIAGNOSIS — G479 Sleep disorder, unspecified: Secondary | ICD-10-CM

## 2023-03-27 DIAGNOSIS — H9313 Tinnitus, bilateral: Secondary | ICD-10-CM

## 2023-03-27 NOTE — Telephone Encounter (Signed)
Last OV: 03/27/2023 Pending OV: 11/07/2023 Medication: Alprazolam 0.5mg  Directions: Take one tablet by mouth daily as needed Last Refill: 02/08/2023 Qty: #60 with 0 refills

## 2023-03-27 NOTE — Progress Notes (Signed)
Subjective:    Patient ID: Gabriel [name], male    DOB: May 28, 1960, 63 y.o.   MRN: [id number]  HPI Here due to ringing in his ears  Started about 6 weeks ago Thinks it is in both ears Seems better when he moves around---notices it more when just sitting around and quiet (more noticeable) Has affected hearing in both ears--he thinks Has pressure sensation in ears Some popping with swallowing  No recent travel No fever No recent URI  Some trouble with feet and right knee---affects his balance Some sense of imbalance otherwise--like climbing a ladder  Has been checking BP---elevated at times 165/90's 150s/80s also  Current Outpatient Medications on File Prior to Visit  Medication Sig Dispense Refill   ALPRAZolam (XANAX) 0.5 MG tablet Take 1 tablet by mouth twice daily as needed 60 tablet 0   amLODipine (NORVASC) 5 MG tablet TAKE 1 TABLET BY MOUTH EVERY DAY 90 tablet 2   celecoxib (CELEBREX) 200 MG capsule TAKE 1 CAPSULE BY MOUTH ONCE DAILY AS NEEDED 30 capsule 5   cyanocobalamin (VITAMIN B12) 500 MCG tablet Take 500 mcg by mouth daily.     esomeprazole (NEXIUM) 40 MG capsule Take 1 capsule by mouth once daily 90 capsule 3   gabapentin (NEURONTIN) 300 MG capsule Take 600 mg by mouth in the morning. And 900 mg at bedtime     sildenafil (REVATIO) 20 MG tablet Take 3-5 tablets (60-100 mg total) by mouth daily as needed. 50 tablet 11   TURMERIC PO Take by mouth.     No current facility-administered medications on file prior to visit.    Allergies  Allergen Reactions   Codeine Sulfate     REACTION: Itching    Past Medical History:  Diagnosis Date   Arthritis    left hand   GERD (gastroesophageal reflux disease)    History of MRSA infection    after chain saw accident   Hyperlipemia    Hypertension     Past Surgical History:  Procedure Laterality Date   COLONOSCOPY  09/05/10   normal    Family History  Problem Relation Age of Onset   Hypertension Mother     Asthma Mother    Anemia Mother    Obesity Brother    Hypertension Brother    Arthritis Maternal Grandmother        rheumatoid   Arthritis Paternal Grandmother        rheumatoid   Arthritis Paternal Grandfather        rheumatoid   Colon cancer Neg Hx     Social History   Socioeconomic History   Marital status: Married    Spouse name: Not on file   Number of children: 3   Years of education: Not on file   Highest education level: Not on file  Occupational History   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    Comment:    Tobacco Use   Smoking status: Former    Passive exposure: Past   Smokeless tobacco: Never   Tobacco comments:    quit 12/10  Substance and Sexual Activity   Alcohol use: Yes    Alcohol/week: 24.0 standard drinks of alcohol    Types: 24 Cans of beer per week    Comment: occ   Drug use: No   Sexual activity: Not on file  Other Topics Concern   Not on file  Social History Narrative   Left handed   Lives with wife and three  kids one story home   Social Drivers of Corporate investment banker Strain: Not on file  Food Insecurity: Not on file  Transportation Needs: Not on file  Physical Activity: Not on file  Stress: Not on file  Social Connections: Not on file  Intimate Partner Violence: Not on file   Review of Systems Some headache--slight No focal weakness, facial droop, aphasia, etc    Objective:   Physical Exam Constitutional:      Appearance: Normal appearance.  HENT:     Right Ear: Tympanic membrane and ear canal normal.     Left Ear: Tympanic membrane and ear canal normal.     Ears:     Comments: Weber doesn't laterize Rinne---air>bone bilaterally Eyes:     Extraocular Movements: Extraocular movements intact.     Comments: No nystagmus  Neurological:     Mental Status: He is alert and oriented to person, place, and time.     Cranial Nerves: Cranial nerves 2-12 are intact.     Motor: No weakness or abnormal muscle tone.      Coordination: Romberg sign negative.     Gait: Gait normal.            Assessment & Plan:

## 2023-03-27 NOTE — Assessment & Plan Note (Signed)
Hearing is different but no localizing or worrisome findings No obvious middle ear disease No vertigo or balance issues that are worrisome Reassured--I don't think it is related to his BP No neurologic issues  Discussed options---I recommend observation for now If persists, will set up with ENT

## 2023-03-27 NOTE — Assessment & Plan Note (Signed)
Still trouble with legs/feet despite the gabapentin 900 twice a day AM better after dose--then it seems to wear off Okay to try afternoon dose

## 2023-03-27 NOTE — Assessment & Plan Note (Signed)
BP Readings from Last 3 Encounters:  03/27/23 (!) 142/86  11/06/22 112/70  11/03/21 132/84   Has noticed it high lately On amlodipine 5mg  daily Will try 10mg  daily----and will change to this if no problems with swelling If doesn't tolerate higher dose, and BP still up---would add valsartan

## 2023-05-18 ENCOUNTER — Other Ambulatory Visit: Payer: Self-pay | Admitting: Internal Medicine

## 2023-06-13 ENCOUNTER — Other Ambulatory Visit: Payer: Self-pay | Admitting: Internal Medicine

## 2023-06-13 DIAGNOSIS — G479 Sleep disorder, unspecified: Secondary | ICD-10-CM

## 2023-06-13 NOTE — Telephone Encounter (Signed)
 Last filled 03-28-23 #60 Last OV Acute 03-27-23 Next OV scheduled for 11-07-23 [facility] [address]

## 2023-08-02 ENCOUNTER — Other Ambulatory Visit: Payer: Self-pay | Admitting: Internal Medicine

## 2023-08-23 ENCOUNTER — Ambulatory Visit (INDEPENDENT_AMBULATORY_CARE_PROVIDER_SITE_OTHER): Payer: Self-pay | Admitting: Internal Medicine

## 2023-08-23 ENCOUNTER — Ambulatory Visit: Payer: Self-pay | Admitting: *Deleted

## 2023-08-23 ENCOUNTER — Encounter: Payer: Self-pay | Admitting: Internal Medicine

## 2023-08-23 VITALS — BP 118/64 | HR 83 | Temp 98.1°F | Ht 69.0 in | Wt 226.0 lb

## 2023-08-23 DIAGNOSIS — H532 Diplopia: Secondary | ICD-10-CM | POA: Insufficient documentation

## 2023-08-23 DIAGNOSIS — R42 Dizziness and giddiness: Secondary | ICD-10-CM

## 2023-08-23 NOTE — Assessment & Plan Note (Addendum)
 Worrisome for cranial nerve abnormality Needs to get back in with Dr Maree now Call 911 if recurrence of spell from 2 days ago May need eye exam as well

## 2023-08-23 NOTE — Progress Notes (Signed)
 Subjective:    Patient ID: Gabriel [name], male    DOB: [birth year], 63 y.o.   MRN: [id number]  HPI Here due to dizziness and vision change  Was talking on the phone 2 days ago--just before hanging up, he felt weird feeling on right side of face Then started having double vision---on a tilt overlying the other image Felt dizzy and balance off (tried to get up and had to sit back down) Lasted 20-30 minutes---BP was 148/87 Since then, he gets swimmy headed upon standing and vision obstructed somehow Not really blurry and can close one eye and it is better. Also does get better on its own Today--able to climb ladders--but some of the same feeling at the top  Feels that when his heart rate goes up when moving around-that is when the vision is affected  No focal weakness No sensory change No aphasia No facial droop No dysphagia  Current Outpatient Medications on File Prior to Visit  Medication Sig Dispense Refill   ALPRAZolam  (XANAX ) 0.5 MG tablet Take 1 tablet by mouth twice daily as needed 60 tablet 0   amLODipine  (NORVASC ) 5 MG tablet TAKE 1 TABLET BY MOUTH EVERY DAY 90 tablet 1   celecoxib  (CELEBREX ) 200 MG capsule TAKE 1 CAPSULE BY MOUTH ONCE DAILY AS NEEDED 90 capsule 0   cyanocobalamin  (VITAMIN B12) 500 MCG tablet Take 500 mcg by mouth daily.     DULoxetine  (CYMBALTA ) 20 MG capsule Take 20 mg by mouth daily.     esomeprazole  (NEXIUM ) 40 MG capsule Take 1 capsule by mouth once daily 90 capsule 3   gabapentin  (NEURONTIN ) 300 MG capsule Take 600 mg by mouth in the morning. And 900 mg at bedtime     sildenafil  (REVATIO ) 20 MG tablet Take 3-5 tablets (60-100 mg total) by mouth daily as needed. 50 tablet 11   TURMERIC PO Take by mouth.     No current facility-administered medications on file prior to visit.    Allergies  Allergen Reactions   Codeine Sulfate     REACTION: Itching    Past Medical History:  Diagnosis Date   Arthritis    left hand   GERD  (gastroesophageal reflux disease)    History of MRSA infection    after chain saw accident   Hyperlipemia    Hypertension     Past Surgical History:  Procedure Laterality Date   COLONOSCOPY  09/05/10   normal    Family History  Problem Relation Age of Onset   Hypertension Mother    Asthma Mother    Anemia Mother    Obesity Brother    Hypertension Brother    Arthritis Maternal Grandmother        rheumatoid   Arthritis Paternal Grandmother        rheumatoid   Arthritis Paternal Grandfather        rheumatoid   Colon cancer Neg Hx     Social History   Socioeconomic History   Marital status: Married    Spouse name: Not on file   Number of children: 3   Years of education: Not on file   Highest education level: Not on file  Occupational History   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    Comment:    Tobacco Use   Smoking status: Former    Passive exposure: Past   Smokeless tobacco: Never   Tobacco comments:    quit 12/10  Substance and Sexual Activity   Alcohol use:  Yes    Alcohol/week: 24.0 standard drinks of alcohol    Types: 24 Cans of beer per week    Comment: occ   Drug use: No   Sexual activity: Not on file  Other Topics Concern   Not on file  Social History Narrative   Left handed   Lives with wife and three kids one story home   Social Drivers of Health   Financial Resource Strain: Not on file  Food Insecurity: Not on file  Transportation Needs: Not on file  Physical Activity: Not on file  Stress: Not on file  Social Connections: Not on file  Intimate Partner Violence: Not on file   Review of Systems Notes stinging and watering in right eye when the sun hits it Sleeps okay No sig new stressors----chronically has a lot going on    Objective:   Physical Exam Constitutional:      Appearance: Normal appearance.  Eyes:     Extraocular Movements: Extraocular movements intact.     Pupils: Pupils are equal, round, and reactive to  light.     Comments: No nystagmus  Neck:     Comments: No carotid bruits Cardiovascular:     Rate and Rhythm: Normal rate and regular rhythm.     Heart sounds: No murmur heard.    No gallop.  Pulmonary:     Effort: Pulmonary effort is normal.     Breath sounds: Normal breath sounds. No wheezing or rales.  Musculoskeletal:     Cervical back: Neck supple.     Right lower leg: No edema.     Left lower leg: No edema.  Lymphadenopathy:     Cervical: No cervical adenopathy.  Neurological:     Mental Status: He is alert and oriented to person, place, and time.     Cranial Nerves: Cranial nerves 2-12 are intact.     Motor: No weakness, atrophy or abnormal muscle tone.     Coordination: Romberg sign negative. Finger-Nose-Finger Test normal.     Gait: Gait normal.            Assessment & Plan:

## 2023-08-23 NOTE — Telephone Encounter (Signed)
 Noted---will assess at today's visit

## 2023-08-23 NOTE — Assessment & Plan Note (Addendum)
 Unusual sensation ---not really orthostatic but is when he gets up Associated with diplopia that is concerning for brain stem dysfunction Exam normal now EKG shows sinus at 76. Normal axis, intervals and no ischemia. Might need to consider angiograms Yield for brain MRI likely low alone---and he has no insurance He is due back seeing Dr Maree (neurology)---I recommended he get back in with him ASAP to decide on the next steps Call 911 for any sig recurrence

## 2023-08-23 NOTE — Telephone Encounter (Signed)
 FYI Only or Action Required?: Action required by provider: request for appointment.  Patient was last seen in primary care on 03/27/2023 by Jimmy Charlie FERNS, MD.  Called Nurse Triage reporting Dizziness.  Symptoms began several days ago.  Interventions attempted: Rest, hydration, or home remedies.  Symptoms are: stable.  Triage Disposition: See Physician Within 24 Hours  Patient/caregiver understands and will follow disposition?: Yes              Copied from CRM [id number]. Topic: Clinical - Red Word Triage >> Aug 23, 2023  9:59 AM Ernestene P wrote: Red Word that prompted transfer to Nurse Triage: double vision and dizziness   ----------------------------------------------------------------------- From previous Reason for Contact - Scheduling: Patient/patient representative is calling to schedule an appointment. Refer to attachments for appointment information. Reason for Disposition  [1] MODERATE dizziness (e.g., interferes with normal activities) AND [2] has NOT been evaluated by doctor (or NP/PA) for this  (Exception: Dizziness caused by heat exposure, sudden standing, or poor fluid intake.)  Answer Assessment - Initial Assessment Questions Patient's wife on DPR not with patient now , reports patient having episode of dizziness , room spinning and double vision, poor balance trying to stand and had to lay back down.  Monday night. Lasted 20-30 minutes. Patient at work now,  continues with blurred vision at times and less dizziness. Patient's wife reports patient denies headache, no weakness , no N/T on either side of body. OV scheduled for today . Recommended if sx worsen call 911 / go to ED.  Reviewed my chart response for patient's wife from CMA recommended OV for evaluation.       1. DESCRIPTION: Describe your dizziness.     Room spinning double vision poor balance  2. LIGHTHEADED: Do you feel lightheaded? (e.g., somewhat faint, woozy, weak upon standing)      Weak upon standing  3. VERTIGO: Do you feel like either you or the room is spinning or tilting? (i.e., vertigo)     Spinning  4. SEVERITY: How bad is it?  Do you feel like you are going to faint? Can you stand and walk?     Can walk now  5. ONSET:  When did the dizziness begin?     Monday night  6. AGGRAVATING FACTORS: Does anything make it worse? (e.g., standing, change in head position)     Standing on Monday night , today feels like when heart rate elevated sx noted  7. HEART RATE: Can you tell me your heart rate? How many beats in 15 seconds?  (Note: Not all patients can do this.)       Caller not with patient now  8. CAUSE: What do you think is causing the dizziness? (e.g., decreased fluids or food, diarrhea, emotional distress, heat exposure, new medicine, sudden standing, vomiting; unknown)     na 9. RECURRENT SYMPTOM: Have you had dizziness before? If Yes, ask: When was the last time? What happened that time?     Yes.  10. OTHER SYMPTOMS: Do you have any other symptoms? (e.g., fever, chest pain, vomiting, diarrhea, bleeding)       Ringing in ears, blurred vision today no double vision .  11. PREGNANCY: Is there any chance you are pregnant? When was your last menstrual period?       na  Protocols used: Dizziness - Lightheadedness-A-AH

## 2023-09-12 ENCOUNTER — Other Ambulatory Visit: Payer: Self-pay | Admitting: Internal Medicine

## 2023-09-12 DIAGNOSIS — G479 Sleep disorder, unspecified: Secondary | ICD-10-CM

## 2023-09-12 NOTE — Telephone Encounter (Signed)
 Last filled 06-13-23 #60 Last OV 08-23-23 Next OV 11-07-23 [facility] [address]

## 2023-09-14 ENCOUNTER — Other Ambulatory Visit: Payer: Self-pay | Admitting: Internal Medicine

## 2023-09-21 ENCOUNTER — Other Ambulatory Visit: Payer: Self-pay | Admitting: Internal Medicine

## 2023-11-07 ENCOUNTER — Encounter: Payer: No Typology Code available for payment source | Admitting: Internal Medicine

## 2023-11-07 ENCOUNTER — Ambulatory Visit (INDEPENDENT_AMBULATORY_CARE_PROVIDER_SITE_OTHER): Payer: Self-pay

## 2023-11-07 VITALS — BP 116/68 | HR 76 | Temp 98.3°F | Ht 68.5 in | Wt 226.0 lb

## 2023-11-07 DIAGNOSIS — M1711 Unilateral primary osteoarthritis, right knee: Secondary | ICD-10-CM | POA: Insufficient documentation

## 2023-11-07 DIAGNOSIS — G629 Polyneuropathy, unspecified: Secondary | ICD-10-CM

## 2023-11-07 DIAGNOSIS — N1831 Chronic kidney disease, stage 3a: Secondary | ICD-10-CM

## 2023-11-07 DIAGNOSIS — F419 Anxiety disorder, unspecified: Secondary | ICD-10-CM

## 2023-11-07 DIAGNOSIS — H532 Diplopia: Secondary | ICD-10-CM

## 2023-11-07 DIAGNOSIS — E785 Hyperlipidemia, unspecified: Secondary | ICD-10-CM

## 2023-11-07 MED ORDER — HYDROXYZINE HCL 25 MG PO TABS
25.0000 mg | ORAL_TABLET | Freq: Three times a day (TID) | ORAL | 0 refills | Status: AC | PRN
Start: 2023-11-07 — End: ?

## 2023-11-07 NOTE — Progress Notes (Signed)
 Subjective:    Patient ID: SHIVA SAHAGIAN, male    DOB: 03/29/1960, 63 y.o.   MRN: [id number]   SAMANTHA OLIVERA is a very pleasant 63 y.o. male who presents today for follow-up of chronic conditions and medication management. Says he uses Celebrex  daily for his knee pain. Says he uses Xanax  for anxiety sparingly, says he splits the tablet in half at work while he is feeling anxious.  Denies daily anxiety, does not want to be on a daily medication.   Review of Systems  All other systems reviewed and are negative.       Allergies  Allergen Reactions   Codeine Sulfate     REACTION: Itching    Current Outpatient Medications on File Prior to Visit  Medication Sig Dispense Refill   amLODipine  (NORVASC ) 5 MG tablet TAKE 1 TABLET BY MOUTH EVERY DAY 90 tablet 1   celecoxib  (CELEBREX ) 200 MG capsule TAKE 1 CAPSULE BY MOUTH ONCE DAILY AS NEEDED 90 capsule 0   DULoxetine  (CYMBALTA ) 30 MG capsule Take 30 mg by mouth daily.     esomeprazole  (NEXIUM ) 40 MG capsule Take 1 capsule by mouth once daily 90 capsule 0   gabapentin  (NEURONTIN ) 300 MG capsule Take 600 mg by mouth in the morning. And 900 mg at bedtime     Omega-3 Fatty Acids (FISH OIL PO) Take by mouth.     sildenafil  (REVATIO ) 20 MG tablet Take 3-5 tablets (60-100 mg total) by mouth daily as needed. 50 tablet 11   Thiamine HCl (VITAMIN B-1) 250 MG tablet Take 250 mg by mouth daily.     TURMERIC PO Take by mouth.     No current facility-administered medications on file prior to visit.    BP 116/68 (BP Location: Left Arm, Patient Position: Sitting, Cuff Size: Large)   Pulse 76   Temp 98.3 F (36.8 C) (Oral)   Ht 5' 8.5 (1.74 m)   Wt 226 lb (102.5 kg)   SpO2 97%   BMI 33.86 kg/m   Objective:    Physical Exam Vitals and nursing note reviewed.  Constitutional:      Appearance: Normal appearance.  HENT:     Head: Normocephalic and atraumatic.  Eyes:     Extraocular Movements: Extraocular movements intact.      Conjunctiva/sclera: Conjunctivae normal.  Skin:    General: Skin is warm.  Neurological:     Mental Status: He is alert.  Psychiatric:        Mood and Affect: Mood normal.        Behavior: Behavior normal.            Assessment & Plan:   1. Anxiety (Primary) Patient has been using as needed Xanax  0.25 mg as needed for bouts of anxiety.  Extensively counseled patient about the side effects of benzodiazepines, including but not limited to cardiorespiratory suppression especially when combined with alcohol, physiological dependence, as well as potential for adverse medication interactions with his Cymbalta  and gabapentin  to increase dizziness, drowsiness or increased risk of falls.  Discussed extensively about safer alternatives for his anxiety including as needed Atarax  versus BuSpar.  Through process of shared decision making patient is agreeable to discontinue Xanax  refills and try Atarax  as below.  Counseled about potential side effect of Atarax  as well. Patient declines need for maintenance daily anti-anxiety medication at this time.  - hydrOXYzine  (ATARAX ) 25 MG tablet; Take 1 tablet (25 mg total) by mouth every 8 (eight) hours as needed.  Dispense: 30 tablet; Refill: 0.  May increase to 2 tablets if no effect.  2. Primary osteoarthritis of right knee 3. Stage 3a chronic kidney disease (HCC) Patient has been using daily Celebrex  for knee pain, extensively counseled about renal injury from long-term daily NSAID use.  Of note, per chart review patient does have a history of GFR in the 50s over the last 2 to 3 years, of note, BMP from May 2025 did show normal creatinine and GFR.  Will repeat BMP given reports of daily Celebrex  use since then, patient agreeable to discontinue it in favor of Tylenol regimen as below.  - Basic Metabolic Panel -Use OTC extra strength Tylenol 2 tablets every 8 hours as needed for knee pain.  4. Peripheral polyneuropathy This has been a chronic problem for  which patient takes gabapentin , unclear etiology, most recent A1c a few months ago was WNL, per chart review, B12 level is normal we will rule out B6 deficiency as a potential cause.  No history of back pain or back injuries.  - Vitamin B6  5. Hyperlipidemia, unspecified hyperlipidemia type Of note, per chart, lipid panel from 1 year ago showed significantly elevated cholesterol, triglycerides of over 500, however, patient has not been on any medication.  Will repeat fasting cholesterol labs as below and determine next steps based on that.  - Lipid Panel    Return in about 2 weeks (around 11/21/2023) for CPE.   Leiam Hopwood K Curtistine Pettitt, MD  11/07/23

## 2023-11-07 NOTE — Patient Instructions (Addendum)
 Thank you for visiting [facility] today! Here's what we talked about: - Only use Celebrex  as needed - Use extra strength Tylenol two of the 500mg  tablets every 8hrs as needed - Stop Xanax , use Atarax  1 tablet as needed for anxiety.

## 2023-11-08 LAB — BASIC METABOLIC PANEL WITH GFR
BUN: 19 mg/dL (ref 6–23)
CO2: 27 meq/L (ref 19–32)
Calcium: 9.3 mg/dL (ref 8.4–10.5)
Chloride: 102 meq/L (ref 96–112)
Creatinine, Ser: 1.28 mg/dL (ref 0.40–1.50)
GFR: 59.5 mL/min — ABNORMAL LOW (ref >60.00)
Glucose, Bld: 94 mg/dL (ref 70–99)
Potassium: 4.4 meq/L (ref 3.5–5.1)
Sodium: 140 meq/L (ref 135–145)

## 2023-11-08 LAB — LIPID PANEL
Cholesterol: 259 mg/dL — ABNORMAL HIGH (ref 0–200)
HDL: 39.6 mg/dL (ref >39.00)
LDL Cholesterol: 151 mg/dL — ABNORMAL HIGH (ref 0–99)
NonHDL: 219.53
Total CHOL/HDL Ratio: 7
Triglycerides: 341 mg/dL — ABNORMAL HIGH (ref 0.0–149.0)
VLDL: 68.2 mg/dL — ABNORMAL HIGH (ref 0.0–40.0)

## 2023-11-10 ENCOUNTER — Ambulatory Visit: Payer: Self-pay

## 2023-11-10 MED ORDER — ATORVASTATIN CALCIUM 40 MG PO TABS: 40.0000 mg | ORAL_TABLET | Freq: Every evening | ORAL | 1 refills | Status: AC

## 2023-11-11 LAB — VITAMIN B6: Vitamin B6: 7.1 ng/mL (ref 2.1–21.7)

## 2023-12-21 ENCOUNTER — Telehealth: Payer: Self-pay

## 2023-12-21 NOTE — Telephone Encounter (Signed)
 Copied from CRM [id number]. Topic: Clinical - Medication Refill >> Dec 21, 2023 10:09 AM Alfonso HERO wrote: Medication: celecoxib  (CELEBREX ) 200 MG capsule gabapentin  (NEURONTIN ) 300 MG capsule esomeprazole  (NEXIUM ) 40 MG capsule  Patient also mentioned Xanax  but I don't see that listed.  Has the patient contacted their pharmacy? No (Agent: If no, request that the patient contact the pharmacy for the refill. If patient does not wish to contact the pharmacy document the reason why and proceed with request.) (Agent: If yes, when and what did the pharmacy advise?)  This is the patient's preferred pharmacy:  [facility] [address] [place] - [address] [place] [address] Phone: [phone] Fax: [phone]   Is this the correct pharmacy for this prescription? Yes If no, delete pharmacy and type the correct one.   Has the prescription been filled recently? Yes  Is the patient out of the medication? Yes  Has the patient been seen for an appointment in the last year OR does the patient have an upcoming appointment? Yes  Can we respond through MyChart? Yes  Agent: Please be advised that Rx refills may take up to 3 business days. We ask that you follow-up with your pharmacy.

## 2023-12-22 MED ORDER — CELECOXIB 200 MG PO CAPS: 200.0000 mg | ORAL_CAPSULE | Freq: Every day | ORAL | 0 refills | Status: AC | PRN

## 2023-12-22 MED ORDER — ESOMEPRAZOLE MAGNESIUM 40 MG PO CPDR: 40.0000 mg | DELAYED_RELEASE_CAPSULE | Freq: Every day | ORAL | 0 refills | Status: DC

## 2023-12-22 MED ORDER — GABAPENTIN 300 MG PO CAPS: 600.0000 mg | ORAL_CAPSULE | Freq: Every morning | ORAL | 1 refills | Status: DC

## 2023-12-23 ENCOUNTER — Other Ambulatory Visit: Payer: Self-pay

## 2024-01-28 ENCOUNTER — Other Ambulatory Visit: Payer: Self-pay

## 2024-01-29 NOTE — Telephone Encounter (Signed)
 Unsure why it came off his med list but says it was reordered last month.

## 2024-01-30 ENCOUNTER — Telehealth: Payer: Self-pay

## 2024-01-30 DIAGNOSIS — M1711 Unilateral primary osteoarthritis, right knee: Secondary | ICD-10-CM

## 2024-01-30 NOTE — Telephone Encounter (Signed)
 Copied from CRM #[id number]. Topic: Clinical - Medication Refill >> Jan 30, 2024 10:11 AM Joesph B wrote: Medication: Celecoxib  200 MG- pharmacy requested this 12/16  Has the patient contacted their pharmacy? Yes (Agent: If no, request that the patient contact the pharmacy for the refill. If patient does not wish to contact the pharmacy document the reason why and proceed with request.) (Agent: If yes, when and what did the pharmacy advise?)  This is the patient's preferred pharmacy:  [facility] [address], [place] - [address] [place] [address] Phone: [phone] Fax: [phone]   Is this the correct pharmacy for this prescription? Yes If no, delete pharmacy and type the correct one.   Has the prescription been filled recently? Yes  Is the patient out of the medication? No  Has the patient been seen for an appointment in the last year OR does the patient have an upcoming appointment? Yes  Can we respond through MyChart? Yes  Agent: Please be advised that Rx refills may take up to 3 business days. We ask that you follow-up with your pharmacy.

## 2024-01-30 NOTE — Telephone Encounter (Signed)
 Refused refill request, using alternative means to manage knee pain per my OV note from 9/24

## 2024-01-30 NOTE — Telephone Encounter (Signed)
 Gabriel [name], CMA routed this conversation to [facility] Gabriel [name], [facility]    01/30/24 10:40 AM Note Celecoxib  200 MG not on current list.       01/30/24 10:12 AM [place], Gabriel [name] routed this conversation to [id number] Nurse Triage [facility]  Gabriel [name] MB   01/30/24 10:12 AM Unsigned Note Copied from CRM #[id number]. Topic: Clinical - Medication Refill >> Jan 30, 2024 10:11 AM Gabriel [name] wrote: Medication: Celecoxib  200 MG- pharmacy requested this 12/16   Has the patient contacted their pharmacy? Yes (Agent: If no, request that the patient contact the pharmacy for the refill. If patient does not wish to contact the pharmacy document the reason why and proceed with request.) (Agent: If yes, when and what did the pharmacy advise?)   This is the patient's preferred pharmacy:  [facility] [address], [place] - [address] [place] [address] Phone: [phone] Fax: [phone]     Is this the correct pharmacy for this prescription? Yes If no, delete pharmacy and type the correct one.    Has the prescription been filled recently? Yes   Is the patient out of the medication? No   Has the patient been seen for an appointment in the last year OR does the patient have an upcoming appointment? Yes   Can we respond through MyChart? Yes   Agent: Please be advised that Rx refills may take up to 3 business days. We ask that you follow-up with your pharmacy.           01/30/24 10:09 AM [name], Gabriel B contacted [employer]

## 2024-01-30 NOTE — Telephone Encounter (Signed)
 Celecoxib  200 MG not on current list.

## 2024-01-30 NOTE — Telephone Encounter (Signed)
 See previous message about this med refill

## 2024-02-04 ENCOUNTER — Telehealth: Payer: Self-pay

## 2024-02-04 NOTE — Telephone Encounter (Signed)
 Copied from CRM #[id number]. Topic: Clinical - Medication Question >> Feb 04, 2024 12:22 PM Sasha M wrote: Reason for CRM: Pt would like to continue taking the Celebrex  medication that was previously prescribed and also start back taking the Xanax . He feels like there was a miscommunication during his last appt and is completely out of medication at this point. Please reconsider refill request at this time and advise pt on next steps.

## 2024-02-04 NOTE — Telephone Encounter (Signed)
 FYI  Copied from CRM #[id number]. Topic: Appointments - Transfer of Care >> Feb 04, 2024 12:25 PM Sasha M wrote: Pt is requesting to transfer FROM: [name] Pt is requesting to transfer TO: [name] Reason for requested transfer: Pt wasn't pleased with previous provider It is the responsibility of the team the patient would like to transfer to (Dr. [name]) to reach out to the patient if for any reason this transfer is not acceptable.

## 2024-02-04 NOTE — Telephone Encounter (Signed)
 Ok with toc

## 2024-02-08 MED ORDER — CELECOXIB 200 MG PO CAPS: 200.0000 mg | ORAL_CAPSULE | Freq: Every day | ORAL | 0 refills | Status: AC | PRN

## 2024-02-08 NOTE — Telephone Encounter (Signed)
 Ok for [employer]

## 2024-02-08 NOTE — Addendum Note (Signed)
 Addended by: BENNETT REUBEN POUR on: 02/08/2024 10:07 PM   Modules accepted: Orders

## 2024-03-12 ENCOUNTER — Encounter: Payer: Self-pay | Admitting: General Practice

## 2024-03-16 ENCOUNTER — Other Ambulatory Visit: Payer: Self-pay

## 2024-04-21 ENCOUNTER — Encounter: Payer: Self-pay | Admitting: General Practice
